# Patient Record
Sex: Male | Born: 1950 | Race: White | Hispanic: No | Marital: Married | Smoking: Never smoker
Health system: Southern US, Community
[De-identification: ages and names within clinical notes are randomized; demographics above are authoritative.]

## PROBLEM LIST (undated history)

## (undated) DIAGNOSIS — F419 Anxiety disorder, unspecified: Secondary | ICD-10-CM

## (undated) DIAGNOSIS — I10 Essential (primary) hypertension: Secondary | ICD-10-CM

## (undated) DIAGNOSIS — E785 Hyperlipidemia, unspecified: Secondary | ICD-10-CM

## (undated) DIAGNOSIS — N189 Chronic kidney disease, unspecified: Secondary | ICD-10-CM

## (undated) DIAGNOSIS — M1990 Unspecified osteoarthritis, unspecified site: Secondary | ICD-10-CM

## (undated) DIAGNOSIS — D869 Sarcoidosis, unspecified: Secondary | ICD-10-CM

## (undated) DIAGNOSIS — G4730 Sleep apnea, unspecified: Secondary | ICD-10-CM

## (undated) HISTORY — PX: CARDIAC CATHETERIZATION: SHX172

## (undated) HISTORY — DX: Sarcoidosis, unspecified: D86.9

## (undated) HISTORY — DX: Chronic kidney disease, unspecified: N18.9

## (undated) HISTORY — DX: Hyperlipidemia, unspecified: E78.5

## (undated) HISTORY — DX: Sleep apnea, unspecified: G47.30

## (undated) HISTORY — DX: Essential (primary) hypertension: I10

---

## 1968-08-06 HISTORY — PX: LUNG BIOPSY: SHX232

## 1998-02-02 ENCOUNTER — Emergency Department (HOSPITAL_COMMUNITY): Admission: EM | Admit: 1998-02-02 | Discharge: 1998-02-02 | Payer: Self-pay | Admitting: Emergency Medicine

## 1998-03-11 ENCOUNTER — Ambulatory Visit: Admission: RE | Admit: 1998-03-11 | Discharge: 1998-03-11 | Payer: Self-pay | Admitting: Internal Medicine

## 1998-03-24 ENCOUNTER — Ambulatory Visit (HOSPITAL_COMMUNITY): Admission: RE | Admit: 1998-03-24 | Discharge: 1998-03-24 | Payer: Self-pay | Admitting: Gastroenterology

## 1998-10-26 ENCOUNTER — Ambulatory Visit (HOSPITAL_COMMUNITY): Admission: RE | Admit: 1998-10-26 | Discharge: 1998-10-26 | Payer: Self-pay | Admitting: Psychiatry

## 1998-11-16 ENCOUNTER — Other Ambulatory Visit: Admission: RE | Admit: 1998-11-16 | Discharge: 1998-11-16 | Payer: Self-pay | Admitting: Urology

## 1998-11-30 ENCOUNTER — Ambulatory Visit (HOSPITAL_COMMUNITY): Admission: RE | Admit: 1998-11-30 | Discharge: 1998-11-30 | Payer: Self-pay | Admitting: Psychiatry

## 1998-12-29 ENCOUNTER — Ambulatory Visit (HOSPITAL_COMMUNITY): Admission: RE | Admit: 1998-12-29 | Discharge: 1998-12-29 | Payer: Self-pay | Admitting: Psychiatry

## 1999-01-31 ENCOUNTER — Ambulatory Visit (HOSPITAL_COMMUNITY): Admission: RE | Admit: 1999-01-31 | Discharge: 1999-01-31 | Payer: Self-pay | Admitting: Psychiatry

## 1999-04-04 ENCOUNTER — Ambulatory Visit (HOSPITAL_COMMUNITY): Admission: RE | Admit: 1999-04-04 | Discharge: 1999-04-04 | Payer: Self-pay | Admitting: Psychiatry

## 1999-06-21 ENCOUNTER — Ambulatory Visit (HOSPITAL_COMMUNITY): Admission: RE | Admit: 1999-06-21 | Discharge: 1999-06-21 | Payer: Self-pay | Admitting: Psychiatry

## 1999-07-25 ENCOUNTER — Ambulatory Visit (HOSPITAL_COMMUNITY): Admission: RE | Admit: 1999-07-25 | Discharge: 1999-07-25 | Payer: Self-pay | Admitting: Psychiatry

## 2000-02-12 ENCOUNTER — Ambulatory Visit: Admission: RE | Admit: 2000-02-12 | Discharge: 2000-02-12 | Payer: Self-pay | Admitting: Critical Care Medicine

## 2000-02-12 ENCOUNTER — Encounter: Payer: Self-pay | Admitting: Critical Care Medicine

## 2000-02-14 ENCOUNTER — Encounter (INDEPENDENT_AMBULATORY_CARE_PROVIDER_SITE_OTHER): Payer: Self-pay | Admitting: Specialist

## 2000-02-14 ENCOUNTER — Other Ambulatory Visit: Admission: RE | Admit: 2000-02-14 | Discharge: 2000-02-14 | Payer: Self-pay | Admitting: Urology

## 2000-02-26 ENCOUNTER — Encounter: Payer: Self-pay | Admitting: Critical Care Medicine

## 2000-02-26 ENCOUNTER — Encounter (INDEPENDENT_AMBULATORY_CARE_PROVIDER_SITE_OTHER): Payer: Self-pay | Admitting: Specialist

## 2000-02-26 ENCOUNTER — Ambulatory Visit (HOSPITAL_COMMUNITY): Admission: RE | Admit: 2000-02-26 | Discharge: 2000-02-26 | Payer: Self-pay | Admitting: Critical Care Medicine

## 2001-12-02 ENCOUNTER — Encounter: Admission: RE | Admit: 2001-12-02 | Discharge: 2001-12-02 | Payer: Self-pay | Admitting: Psychiatry

## 2002-03-14 ENCOUNTER — Emergency Department (HOSPITAL_COMMUNITY): Admission: EM | Admit: 2002-03-14 | Discharge: 2002-03-14 | Payer: Self-pay | Admitting: Emergency Medicine

## 2002-04-10 ENCOUNTER — Encounter: Admission: RE | Admit: 2002-04-10 | Discharge: 2002-04-10 | Payer: Self-pay | Admitting: Psychiatry

## 2002-08-25 ENCOUNTER — Encounter: Payer: Self-pay | Admitting: *Deleted

## 2002-08-26 ENCOUNTER — Encounter (INDEPENDENT_AMBULATORY_CARE_PROVIDER_SITE_OTHER): Payer: Self-pay | Admitting: *Deleted

## 2002-08-26 ENCOUNTER — Ambulatory Visit (HOSPITAL_COMMUNITY): Admission: RE | Admit: 2002-08-26 | Discharge: 2002-08-27 | Payer: Self-pay | Admitting: *Deleted

## 2003-03-26 ENCOUNTER — Encounter: Admission: RE | Admit: 2003-03-26 | Discharge: 2003-03-26 | Payer: Self-pay | Admitting: Emergency Medicine

## 2003-03-26 ENCOUNTER — Encounter: Payer: Self-pay | Admitting: Emergency Medicine

## 2003-04-19 ENCOUNTER — Encounter: Admission: RE | Admit: 2003-04-19 | Discharge: 2003-04-19 | Payer: Self-pay | Admitting: Emergency Medicine

## 2004-06-22 ENCOUNTER — Ambulatory Visit (HOSPITAL_COMMUNITY): Payer: Self-pay | Admitting: Psychiatry

## 2004-08-06 HISTORY — PX: NASAL SINUS SURGERY: SHX719

## 2005-05-25 ENCOUNTER — Emergency Department (HOSPITAL_COMMUNITY): Admission: EM | Admit: 2005-05-25 | Discharge: 2005-05-25 | Payer: Self-pay | Admitting: Emergency Medicine

## 2005-08-06 HISTORY — PX: BACK SURGERY: SHX140

## 2005-08-06 HISTORY — PX: NECK SURGERY: SHX720

## 2005-08-26 ENCOUNTER — Encounter: Admission: RE | Admit: 2005-08-26 | Discharge: 2005-08-26 | Payer: Self-pay | Admitting: Emergency Medicine

## 2005-09-04 ENCOUNTER — Ambulatory Visit (HOSPITAL_COMMUNITY): Payer: Self-pay | Admitting: Psychiatry

## 2005-10-24 ENCOUNTER — Ambulatory Visit (HOSPITAL_COMMUNITY): Payer: Self-pay | Admitting: Psychiatry

## 2005-12-06 ENCOUNTER — Ambulatory Visit (HOSPITAL_COMMUNITY): Admission: RE | Admit: 2005-12-06 | Discharge: 2005-12-07 | Payer: Self-pay | Admitting: Orthopaedic Surgery

## 2005-12-17 ENCOUNTER — Ambulatory Visit (HOSPITAL_COMMUNITY): Payer: Self-pay | Admitting: Psychiatry

## 2006-03-27 ENCOUNTER — Ambulatory Visit (HOSPITAL_COMMUNITY): Payer: Self-pay | Admitting: Psychiatry

## 2006-06-24 ENCOUNTER — Ambulatory Visit (HOSPITAL_COMMUNITY): Payer: Self-pay | Admitting: Psychiatry

## 2007-01-22 ENCOUNTER — Ambulatory Visit (HOSPITAL_COMMUNITY): Payer: Self-pay | Admitting: Psychiatry

## 2007-02-18 ENCOUNTER — Ambulatory Visit (HOSPITAL_COMMUNITY): Admission: RE | Admit: 2007-02-18 | Discharge: 2007-02-18 | Payer: Self-pay | Admitting: Cardiology

## 2007-05-22 ENCOUNTER — Ambulatory Visit (HOSPITAL_COMMUNITY): Payer: Self-pay | Admitting: Psychiatry

## 2007-07-17 ENCOUNTER — Encounter: Admission: RE | Admit: 2007-07-17 | Discharge: 2007-07-17 | Payer: Self-pay | Admitting: Emergency Medicine

## 2007-09-11 ENCOUNTER — Ambulatory Visit (HOSPITAL_COMMUNITY): Payer: Self-pay | Admitting: Psychiatry

## 2008-08-31 ENCOUNTER — Emergency Department (HOSPITAL_COMMUNITY): Admission: EM | Admit: 2008-08-31 | Discharge: 2008-09-01 | Payer: Self-pay | Admitting: Emergency Medicine

## 2008-11-17 ENCOUNTER — Inpatient Hospital Stay (HOSPITAL_COMMUNITY): Admission: RE | Admit: 2008-11-17 | Discharge: 2008-11-21 | Payer: Self-pay | Admitting: *Deleted

## 2008-11-17 ENCOUNTER — Emergency Department (HOSPITAL_COMMUNITY): Admission: EM | Admit: 2008-11-17 | Discharge: 2008-11-17 | Payer: Self-pay | Admitting: Emergency Medicine

## 2008-11-19 ENCOUNTER — Ambulatory Visit: Payer: Self-pay | Admitting: *Deleted

## 2008-11-26 ENCOUNTER — Emergency Department (HOSPITAL_COMMUNITY): Admission: EM | Admit: 2008-11-26 | Discharge: 2008-11-26 | Payer: Self-pay | Admitting: Family Medicine

## 2009-02-01 ENCOUNTER — Emergency Department (HOSPITAL_COMMUNITY): Admission: EM | Admit: 2009-02-01 | Discharge: 2009-02-01 | Payer: Self-pay | Admitting: Emergency Medicine

## 2009-02-04 ENCOUNTER — Ambulatory Visit (HOSPITAL_COMMUNITY): Admission: RE | Admit: 2009-02-04 | Discharge: 2009-02-04 | Payer: Self-pay | Admitting: Internal Medicine

## 2009-02-04 ENCOUNTER — Encounter (HOSPITAL_COMMUNITY): Admission: RE | Admit: 2009-02-04 | Discharge: 2009-04-07 | Payer: Self-pay | Admitting: Emergency Medicine

## 2009-03-02 DIAGNOSIS — E785 Hyperlipidemia, unspecified: Secondary | ICD-10-CM | POA: Insufficient documentation

## 2009-03-02 DIAGNOSIS — F329 Major depressive disorder, single episode, unspecified: Secondary | ICD-10-CM | POA: Insufficient documentation

## 2009-06-01 DIAGNOSIS — R059 Cough, unspecified: Secondary | ICD-10-CM | POA: Insufficient documentation

## 2009-10-14 DIAGNOSIS — R972 Elevated prostate specific antigen [PSA]: Secondary | ICD-10-CM | POA: Insufficient documentation

## 2009-10-14 DIAGNOSIS — N529 Male erectile dysfunction, unspecified: Secondary | ICD-10-CM | POA: Insufficient documentation

## 2009-11-13 ENCOUNTER — Emergency Department (HOSPITAL_COMMUNITY): Admission: EM | Admit: 2009-11-13 | Discharge: 2009-11-13 | Payer: Self-pay | Admitting: Emergency Medicine

## 2010-08-27 ENCOUNTER — Encounter: Payer: Self-pay | Admitting: Emergency Medicine

## 2010-10-25 LAB — POCT I-STAT, CHEM 8
Creatinine, Ser: 1.5 mg/dL (ref 0.4–1.5)
Glucose, Bld: 99 mg/dL (ref 70–99)
HCT: 51 % (ref 39.0–52.0)
Hemoglobin: 17.3 g/dL — ABNORMAL HIGH (ref 13.0–17.0)
Potassium: 4.1 mEq/L (ref 3.5–5.1)
Sodium: 140 mEq/L (ref 135–145)

## 2010-10-25 LAB — DIFFERENTIAL
Basophils Absolute: 0.1 10*3/uL (ref 0.0–0.1)
Monocytes Absolute: 0.6 10*3/uL (ref 0.1–1.0)
Neutrophils Relative %: 79 % — ABNORMAL HIGH (ref 43–77)

## 2010-10-25 LAB — POCT CARDIAC MARKERS
CKMB, poc: 2.9 ng/mL (ref 1.0–8.0)
Troponin i, poc: <0.05 ng/mL (ref 0.00–0.09)
Troponin i, poc: <0.05 ng/mL (ref 0.00–0.09)

## 2010-10-25 LAB — CBC
Platelets: 208 10*3/uL (ref 150–400)
RDW: 13 % (ref 11.5–15.5)
WBC: 9.2 10*3/uL (ref 4.0–10.5)

## 2010-11-15 LAB — POCT I-STAT, CHEM 8
BUN: 22 mg/dL (ref 6–23)
Calcium, Ion: 1.2 mmol/L (ref 1.12–1.32)
Chloride: 104 mEq/L (ref 96–112)
Creatinine, Ser: 1.7 mg/dL — ABNORMAL HIGH (ref 0.4–1.5)
HCT: 40 % (ref 39.0–52.0)
Hemoglobin: 13.6 g/dL (ref 13.0–17.0)
Sodium: 137 mEq/L (ref 135–145)
TCO2: 25 mmol/L (ref 0–100)

## 2010-11-15 LAB — POCT URINALYSIS DIP (DEVICE)
Ketones, ur: NEGATIVE mg/dL
Nitrite: NEGATIVE
Specific Gravity, Urine: >=1.03 (ref 1.005–1.030)
pH: 5.5 (ref 5.0–8.0)

## 2010-11-15 LAB — URINE CULTURE: Culture: NO GROWTH

## 2010-11-15 LAB — HEPATIC FUNCTION PANEL
ALT: 22 U/L (ref 0–53)
AST: 27 U/L (ref 0–37)
Albumin: 3.5 g/dL (ref 3.5–5.2)
Indirect Bilirubin: 0.2 mg/dL — ABNORMAL LOW (ref 0.3–0.9)
Total Bilirubin: 0.3 mg/dL (ref 0.3–1.2)
Total Protein: 6.7 g/dL (ref 6.0–8.3)

## 2010-11-15 LAB — URINALYSIS, ROUTINE W REFLEX MICROSCOPIC
Nitrite: NEGATIVE
Protein, ur: 30 mg/dL — AB

## 2010-11-15 LAB — CBC
MCV: 91.8 fL (ref 78.0–100.0)
Platelets: 232 10*3/uL (ref 150–400)
RBC: 4.34 MIL/uL (ref 4.22–5.81)
RDW: 13.4 % (ref 11.5–15.5)
WBC: 14 10*3/uL — ABNORMAL HIGH (ref 4.0–10.5)

## 2010-11-15 LAB — URINE MICROSCOPIC-ADD ON

## 2010-11-15 LAB — DIFFERENTIAL
Basophils Absolute: 0 10*3/uL (ref 0.0–0.1)
Basophils Relative: 0 % (ref 0–1)
Eosinophils Absolute: 0 10*3/uL (ref 0.0–0.7)
Lymphs Abs: 0.9 10*3/uL (ref 0.7–4.0)
Neutro Abs: 12.1 10*3/uL — ABNORMAL HIGH (ref 1.7–7.7)

## 2010-11-15 LAB — T4, FREE: Free T4: 0.88 ng/dL — ABNORMAL LOW (ref 0.80–1.80)

## 2010-11-15 LAB — RAPID URINE DRUG SCREEN, HOSP PERFORMED
Barbiturates: NOT DETECTED
Benzodiazepines: POSITIVE — AB
Cocaine: NOT DETECTED
Tetrahydrocannabinol: NOT DETECTED

## 2010-11-15 LAB — TSH: TSH: 3.07 u[IU]/mL (ref 0.350–4.500)

## 2010-11-20 LAB — BASIC METABOLIC PANEL
Calcium: 9.4 mg/dL (ref 8.4–10.5)
Chloride: 105 mEq/L (ref 96–112)
Sodium: 136 mEq/L (ref 135–145)

## 2010-11-20 LAB — BRAIN NATRIURETIC PEPTIDE: Pro B Natriuretic peptide (BNP): 44 pg/mL (ref 0.0–100.0)

## 2010-11-20 LAB — DIFFERENTIAL
Basophils Absolute: 0 10*3/uL (ref 0.0–0.1)
Eosinophils Absolute: 0 10*3/uL (ref 0.0–0.7)
Lymphs Abs: 0.8 10*3/uL (ref 0.7–4.0)
Monocytes Absolute: 0.6 10*3/uL (ref 0.1–1.0)
Neutrophils Relative %: 86 % — ABNORMAL HIGH (ref 43–77)

## 2010-11-20 LAB — CK TOTAL AND CKMB (NOT AT ARMC)
Relative Index: 2.3 (ref 0.0–2.5)
Relative Index: 2.3 (ref 0.0–2.5)

## 2010-11-20 LAB — CBC
Hemoglobin: 15.4 g/dL (ref 13.0–17.0)
RDW: 12.8 % (ref 11.5–15.5)
WBC: 10.5 10*3/uL (ref 4.0–10.5)

## 2010-11-20 LAB — GLUCOSE, CAPILLARY: Glucose-Capillary: 139 mg/dL — ABNORMAL HIGH (ref 70–99)

## 2010-11-20 LAB — TROPONIN I: Troponin I: <0.01 ng/mL (ref 0.00–0.06)

## 2010-11-20 LAB — POCT CARDIAC MARKERS: Troponin i, poc: <0.05 ng/mL (ref 0.00–0.09)

## 2010-12-19 NOTE — H&P (Signed)
NAME:  Alfred Stephenson, Alfred Stephenson NO.:  [id number]   MEDICAL RECORD NO.:  [id number]          PATIENT TYPE:  IPS   LOCATION:  0305                          FACILITY:  [facility]   PHYSICIAN:  Jasmine Pang, M.D. DATE OF BIRTH:  May 16, 1951   DATE OF ADMISSION:  11/17/2008  DATE OF DISCHARGE:                       PSYCHIATRIC ADMISSION ASSESSMENT   DATE OF ASSESSMENT:  November 18, 2008 at 10:10 a.m.   IDENTIFYING INFORMATION:  This is a 60 year old male.  This is a  voluntary admission.   HISTORY OF THE PRESENT ILLNESS:  First [facility] admission for this 57-year-  old separated gentleman who was referred by his psychiatric provide  where he presented with suicidal thoughts, and had developed a plan that  he was going to electrocute himself using the generator in the well that  he had at home.  He reports that he has been despondent, hopeless,  inconsolable since his wife moved out September 07, 2008 because she felt  that she needed some time to herself to evaluate their marriage.  He  reports they have been married 36 years, and he does not feel that he  can function without her; is going to kill himself if he cannot get  reconciled with her.  Has begged her to come home, but she continues to  want some time alone in her own apartment.  He reports 1 month of  anhedonia.  Had an episode of constant crying spells, worse a couple of  weeks ago before starting on Ativan.  Suicidal thoughts for somewhere  between one and two weeks.  Sleep decreasing down to about 2-3 hours per  night.  He has kept up his work schedule, has not missed any work.  Tearful with depressed mood.  No interest in life.  Denies  hallucinations or homicidal thoughts.   PAST PSYCHIATRIC HISTORY:  First [facility] admission.  He reports an onset of  depression on or around age 24 and reports at that time having  fluctuating intervals of mostly irritability with verbal outbursts and  denies any history of violence or  physically acting out.  He was started  on Prozac around that time and felt that he did very well and was under  the care of [facility].  After  taking Prozac for quite some time, he does not remember why he came off  of it.  He had a trial of Wellbutrin which did not help him, and then  was on Effexor XR for approximately 7 years with a dose finally  reaching 300 mg daily while under the care of Dr. Ladona Ridgel at [facility].  He began seeing Lyndel Safe and the nurse practitioner at Dr.  Tish Men office [place] about 4 weeks  ago and at that time was started on his current medication, Zoloft.  Denies any trials of mood stabilizers, stimulants or antipsychotics, and  he denies a history of prior suicide attempts.   SOCIAL HISTORY:  Married 34 years.  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 to 7:00 p.m. and then 2 weeks 7:00 p.m. to 7:00  a.m. alternating.  No legal problems.  Has a 65 year old son with his  wife.  They are getting counseling together from [facility], and he is  unsure of the future of their marriage.  Denies any history of substance  abuse.   FAMILY HISTORY:  Remarkable for mother with Alzheimer's and with  unspecified mood disorder.   MEDICAL PROBLEMS:  Hypertension.   PRIMARY CARE Tekoa Hamor:  Dr. Jarold Motto at [facility].   DRUG ALLERGIES:  NONE.   MEDICATIONS:  1. Unknown blood pressure medication.  2. Zoloft 150 mg daily.  3. Lorazepam 1 mg q.i.d. which he reports he has been taking for about      3 or 4 weeks because he was shaking so much on the Zoloft.   POSITIVE PHYSICAL FINDINGS:  A fully alert male, cooperative, in no  distress, with normal vital signs on presentation.  Full physical exam  done in the emergency room as noted in the record.   LABORATORY DATA:  Chemistry  - sodium 137, potassium 3.3, chloride 104,  carbon dioxide is 25, BUN 22, creatinine 1.7.  CBC - WBC 14, hemoglobin  13.6, hematocrit 39.8, platelets 207,000 and MCV 91.8.  Urine drug  screen was positive for benzodiazepines and alcohol level less than 5.   MENTAL STATUS EXAM:  Reveals a fully alert male with a blunted affect,  somewhat decreased concentration and mildly anxious in appearance.  Clearly focused on his severe grief and loss issues.  Speech normal.  Gives a fairly coherent history.  Mood is quite depressed.  Cannot see  any other options except to be reunited with his wife.  Apparently had  been calling her three times a day and possibly more.  Now is restricted  to calling her once a day, and she will not recommit to the marriage.  He feels hopeless about the future.  Cognition is intact.  No delusional  statements.  No evidence of psychosis.  No homicidal thoughts.   IMPRESSION:  AXIS I:  Major depression, recurrent, severe.  AXIS II:  Deferred.  AXIS III:  Hypertension by history.  AXIS IV:  Severe issues with relationships and marital separation.  AXIS V:  Current 42; past year not known.   PLAN:  The plan is to voluntarily admit with a goal of stabilizing his  mood and alleviating his suicidal thoughts.  We are going to start him  on Celexa 20 mg daily and discontinue the Zoloft and Abilify 2 mg daily.      Margaret A. Lorin Picket, N.P.      Jasmine Pang, M.D.  Electronically Signed    MAS/MEDQ  D:  11/18/2008  T:  11/18/2008  Job:  [id number]

## 2010-12-19 NOTE — Cardiovascular Report (Signed)
NAME:  Alfred Stephenson, Alfred Stephenson NO.:  [id number]   MEDICAL RECORD NO.:  [id number]          PATIENT TYPE:  INP   LOCATION:  2857                         FACILITY:  [facility]   PHYSICIAN:  Francisca December, M.D.  DATE OF BIRTH:  [birth year]   DATE OF PROCEDURE:  02/18/2007  DATE OF DISCHARGE:                            CARDIAC CATHETERIZATION   PROCEDURES PERFORMED:  1. Left heart catheterization.  2. Left ventriculogram.  3. Coronary angiography.   INDICATIONS:  Mr. Alfred Stephenson is a 60 year old man, with several risk  factors for coronary heart disease, who has developed an atypical  anginal syndrome.  A myocardial perfusion study showed he was able to  exercise for 10 minutes and 15 seconds to a heart rate of 95% maximal  predicted.  Felt somewhat short of breath. Did not have develop index  symptoms.  There was a reversible inferior, inferoseptal, and mid  inferior defect.  Not completely reversible.  He is brought to the  catheterization laboratory at this time to identify the extent of  disease and provide for further therapeutic options.   PROCEDURAL NOTE:  The patient is brought to the cardiac catheterization  laboratory in the fasting state.  The right groin was prepped and draped  in the usual sterile fashion.  Local anesthesia was obtained with  infiltration 1% lidocaine.  A 5-French catheter sheath was inserted into  the right femoral artery utilizing an anterior approach over guiding J-  wire.  Left heart catheterization and coronary angiography then  proceeded in the standard fashion using 5-French #4 left and right  Judkins catheters and a 110 cm pigtail catheter.  A 30-degrees RAO cine  left ventriculogram was performed utilizing a power injector; 45 mL were  injected at 13 mL per second.  At the completion of the procedure, the  catheter and catheter sheath were removed.  Hemostasis was achieved by  direct pressure.  The patient was transported to the  recovery area in  stable condition with an intact distal pulse.  All catheter  manipulations were performed using fluoroscopic observation and  exchanges performed over a long guiding J-wire.   HEMODYNAMIC RESULTS:  Systemic arterial pressure is 103/70 with a mean  of 86 mmHg.  There is no systolic gradient across the aortic valve.  The  left ventricular end-diastolic pressure is 13 mmHg pre-ventriculogram.   ANGIOGRAPHY:  The left ventriculogram demonstrated normal chamber size  and normal global systolic function without regional wall motion  abnormality.  A visual estimated ejection fraction is 55-60%.  There is  no mitral vegetation.  There is no coronary calcification seen.  The  aortic valve is trileaflet and opens normally during systole.   There is a right-dominant coronary system present.  The main left  coronary is normal.   The left anterior descending artery and its branches are normal.  The  single large diagonal branch arises.  This is fairly proximal.  It is  proximal to the first septal perforator.  It contains no obstruction.  The ongoing anterior descending artery reaches and barely traverses the  apex.  There  are no obstructions seen nor are there even luminal  irregularities.   The left circumflex coronary artery and its branches are normal.  A  large branching first marginal is present and then two very small second  and third marginals.  The fourth marginal is on the true obtuse margin  of the heart, and then there is a small posterolateral branch.  There  are no obstructions in the main trunk of the circumflex or in these  branch vessels.   The right coronary artery and its branches are large and dominant.  There is a 20% eccentric narrowing at the junction of the mid proximal  segment.  The mid and distal segments otherwise are completely free of  any disease or luminal irregularities.  The distal segment of the right  coronary gives rise to a large  posterior descending artery and a large  posterolateral segment with a single large branching posterolateral or  left ventricular branch.  There are no obstructions in these distal  vessels either.   Collateral vessels are not seen.   FINAL IMPRESSION:  1. Essentially normal coronary arteries without evidence of      atherosclerotic obstruction.  2. Intact left ventricular size and global systolic function, ejection      fraction 55-60%.   PLAN:  Continue medical therapy and risk factor modification only are  indicated.      Francisca December, M.D.  Electronically Signed     JHE/MEDQ  D:  02/18/2007  T:  02/18/2007  Job:  [id number]   cc:   Reuben Likes, M.D.

## 2010-12-19 NOTE — Discharge Summary (Signed)
NAME:  Alfred Stephenson, Alfred Stephenson NO.:  [id number]   MEDICAL RECORD NO.:  [id number]          PATIENT TYPE:  IPS   LOCATION:  0305                          [facility]ACILITY[facility]   PHYSICIAN:  Jasmine Pang, M.D. DATE OF BIRTH:  Feb 17, 1951   DATE OF ADMISSION:  11/17/2008  DATE OF DISCHARGE:  11/21/2008                               DISCHARGE SUMMARY   IDENTIFICATION:  This is a 60 year old separated white male who was  admitted on a voluntary basis.   HISTORY OF PRESENT ILLNESS:  This is the first [facility] admission for this 42-  year-old separated gentleman, who was referred by his psychiatric  Irbin Fines,  Colen Darling.  He presented to her with suicidal thoughts and  had developed a plan that he was going to electrocute himself using a  generator and avail that he had at home.  He reports he has been  despondent, hopeless, and consolable since his wife moved out September 07, 2008 because she felt she needed sometime to herself to evaluate  their marriage.  For further admission information, see psychiatric  admission assessment.   PHYSICAL FINDINGS:  The patient's full physical exam was done in the  emergency room.  There were no acute physical or medical problems noted.   ADMISSION LABORATORIES:  Chemistries revealed a sodium of 137, potassium  of 3.3, chloride of 104, carbon dioxide is 25, BUN of 22, creatinine  1.7.  CBC reveals WBC of 14, hemoglobin of 13.6, hematocrit of 39.8,  platelets 207,000, and MCV was 91.8.  Urine drug screen was positive for  benzodiazepines and alcohol level less than 5.  He is prescribed  lorazepam.   HOSPITAL COURSE:  Upon admission, the patient was continued on his home  medication of Ativan 1 mg p.o. q.i.d. and Zoloft 150 mg p.o. daily.  He  was also started on Ambien 10 mg p.o. q.h.s. p.r.n. insomnia.  On November 18, 2008, the patient was started on Abilify 2 mg p.o. q. day and Ativan  was changed to 1 mg p.o. t.i.d. p.r.n. anxiety.  Zoloft  was  discontinued.  He was started on Celexa 20 mg p.o. q. day.  In  individual sessions, the patient was reserved, but cooperative.  He  stated he felt like the Zoloft was not helping and it was switched to  Celexa as indicated above.  He denied he wants to hurt himself.  He does  note increasing depression with frequent tearfulness.  On November 19, 2008, the patient continued to be depressed and anxious.  He was  ruminating about separation with his wife I still miss her.  He stated  his wife has agreed to go to marital counseling.  The patient was  focused on wanting to go home.  On November 21, 2008, sleep was good.  Appetite good.  Mood was less depressed, less anxious.  Affect  consistent with mood.  There was no suicidal or homicidal ideation.  No  thoughts of self-injurious behavior.  No auditory or visual  hallucinations.  No paranoia or delusions.  Thoughts were logical and  goal-directed.  Thought content.  No predominant theme.  Cognitive was  grossly intact.  It was felt the patient was safe for discharge.  There  was a brief meeting with his wife and the counselor.  Wife was concerned  that he may be getting discharge to quickly, but the patient says he has  been going to groups and speaking with staff.  He no longer feels  suicidal and feels ready for discharge.  He feels new medications were  helping him.  Wife agreed that he did seem improved in terms of less  anxiety and less emotionality.  Wife said the patient does have  supportive friends, who are going to come by to see the patient today.  The patient says if he has suicidal thoughts, he will call his wife or  his mental health providers.  He plans to continue seeing his mental  health providers on a weekly basis.  After talking with their counselor,  wife felt the patient would not receive any further benefit from  hospitalization and would be better returning to his outpatient  providers.   DISCHARGE DIAGNOSES:  Axis  I:  Major depression, recurrent severe  without psychosis.  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  Axis III:  Hypertension by history.  Axis IV:  Severe (issues with relationship and marital separation,  burden of psychiatric illness).  Axis V:  Global assessment of functioning was 54 upon discharge.  GAF  was 44 upon admission.  GAF highest past year was 70.   DISCHARGE PLANS:  There was no specific activity level or dietary  restrictions.   POST HOSPITAL CARE PLANS:  The patient will see Clerance Lav T for  followup medication management.  He will see a Colen Darling, his  counselor for further therapy.   DISCHARGE MEDICATIONS:  1. Abilify 2 mg daily.  2. Celexa 20 mg daily.      Jasmine Pang, M.D.  Electronically Signed     BHS/MEDQ  D:  11/22/2008  T:  11/23/2008  Job:  [id number]

## 2010-12-22 NOTE — Op Note (Signed)
NAME:  Alfred Stephenson, Alfred Stephenson NO.:  [id number]   MEDICAL RECORD NO.:  [id number]          PATIENT TYPE:  OIB   LOCATION:  5006                         FACILITY:  [facility]   PHYSICIAN:  Sharolyn Douglas, M.D.        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   PROCEDURES:  1.  Anterior cervical diskectomy, C5-6 and C6-7, with decompression of the      spinal cord and nerve roots bilaterally.  2.  Anterior cervical arthrodesis, C5-6 and C6-7, with placement of two      allograft prosthesis spacers packed with local autogenous bone graft.  3.  Anterior cervical plating, C5 through C7, using the Abbott spine system.   SURGEON:  Sharolyn Douglas, M.D.   ASSISTANT:  Verlin Fester, P.A.   ANESTHESIA:  General endotracheal.   COMPLICATIONS:  None.   ESTIMATED BLOOD LOSS:  Minimal.   INDICATIONS:  The patient is a 60 year old pleasant male with persistent  neck and right greater than left upper extremity pain.  His MRI scan shows  degenerative changes with disk herniations and spondylosis most severe at C5-  6 and C6-7 with underlying spinal stenosis and foraminal narrowing.  At this  time he presents for ACDF at these levels in hopes of improving his  symptoms.  Risk and benefits were reviewed.   PROCEDURE:  The patient was identified in the holding area and taken to the  operating room, underwent general endotracheal anesthesia without  difficulty, given prophylactic IV antibiotics.  He was carefully positioned  on the operating room table with his neck in slight extension on the  Mayfield headrest.  Five pounds of halter traction applied.  The neck was  prepped, draped in the usual sterile fashion.  A small transverse incision  made at the level of the cricoid cartilage  on the left side.  Dissection was  carried to the platysma.  The interval between the SCM and strap muscles  medially was developed down to the prevertebral space.  The esophagus,  trachea and carotid sheath were identified and protected at all times.  The  C5-6 disk space was easily identifiable by the large anterior osteophyte.  My assistant helped during the exposure using the handheld retractors.  A  spinal needle was placed into the C5-6 disk space.  Intraoperative x-ray  taken to confirm this level.  We then exposed the C5-6 and C6-7 disk spaces  bilaterally by elevating the longus colli muscle, deep retractors were  placed.  The microscope was draped and brought into the field.  My assistant  worked under the microscope with me using the sucker and retracting.  Caspar  distraction pins were placed in the C5, C6, C7 vertebral bodies and gentle  distraction applied.  Starting at C6-7, a radical diskectomy was completed  back to the posterior longitudinal ligament.  The uncovertebral joints were  overgrown.  The cartilaginous endplates were scraped clean.  A high-speed  bur used to take down the uncovertebral joints and the posterior vertebral  margins.  A 2 mm Kerrison used to remove the posterior longitudinal  ligament.  We found subligamentous disk herniations which extended into the  foramen, right greater than left.  This was completely decompressed.  Foraminotomies were completed.  Nerve hook used to explore the spinal canal  and foramen.  We felt we had a good decompression.  Hemostasis achieved.  A  6 mL allograft prosthesis spacer was then packed with local autogenous bone  graft from the drill shavings.  This was countersunk 1 mm into the  interspace at C6-7.  We then turned our attention to completing a similar  procedure at C5-6.  Again we found subligamentous disk herniations which  extended into the right foramen.  The posterior longitudinal ligament was  completely taken  down.  Wide foraminotomies were completed, the disk  herniation was decompressed.  The canal was again explored with the nerve  hook and we felt that it was well-decompressed.  Hemostasis achieved.  This  time a 7 mm allograft prosthesis spacer was packed with local bone graft  from the drill shavings and again countersunk 1 mm into the interspace.  We  then applied a 42 mm Abbott Spine anterior cervical plate from Z6-X0 was six  12 mm screws.  We had excellent screw purchase and we ensured that the  locking mechanism engaged.  The wound was irrigated.  The esophagus,  trachea, carotid  sheath were evaluated.  There were no apparent injuries.  Hemostasis was achieved.  Deep Penrose drain left in place.  The platysma  closed with interrupted 2-0 Vicryl, subcutaneous layer closed with 3-0  Vicryl and 4-0 subcuticular Vicryl in the skin edges.  Benzoin and Steri-  Strips placed.  Sterile dressing applied.  Soft collar placed.  The patient  was extubated without difficulty and transferred to recovery in stable  condition able to move his upper and lower extremities.  Intraoperative x-  ray taken showed the instrumentation at C5-6.  It was difficult to visualize  the C6-7 level because of the patient's body habitus.  Formal x-rays will be  taken in the radiology department before discharge.      Sharolyn Douglas, M.D.  Electronically Signed     MC/MEDQ  D:  12/06/2005  T:  12/07/2005  Job:  [id number]

## 2010-12-22 NOTE — Op Note (Signed)
NAME:  Alfred Stephenson, Alfred Stephenson NO.:  [id number]   MEDICAL RECORD NO.:  [id number]                   PATIENT TYPE:  OIB   LOCATION:  2550                                 FACILITY:  [facility]   PHYSICIAN:  Veverly Fells. Arletha Grippe, M.D.             DATE OF BIRTH:  Jul 22, 1951   DATE OF PROCEDURE:  08/26/2002  DATE OF DISCHARGE:                                 OPERATIVE REPORT   PREOPERATIVE DIAGNOSIS:  Obstructive sleep apnea, septal deviation, inferior  turbinate hypertrophy and nasal airway obstruction.   POSTOPERATIVE DIAGNOSIS:  Obstructive sleep apnea, septal deviation,  inferior turbinate hypertrophy and nasal airway obstruction.   OPERATION PERFORMED:  Nasal septum reconstruction, intramural cauterization  both inferior turbinates.   SURGEON:  Veverly Fells. Arletha Grippe, M.D.   ANESTHESIA:  General endotracheal.   INDICATIONS FOR PROCEDURE:  The patient is a 60 year old white male being  followed by Clinton D. Maple Hudson, M.D. and Reuben Likes, M.D.  History of  recurrent epistaxis, nasal airway obstruction.  He has been diagnosed with  moderately severe obstructive sleep apnea based on a sleep test in March of  1999.  Respiratory disturbance index was noted to be 40 events per hour with  desaturations down to 83% with normal cardiac rhythm.  He has tried nasal  CPAP and actually BIPAP but due to his nasal anatomy, he has had significant  problems wearing this apparatus.  Physical examination did show severe  septal deviation to the left with inferior turbinate congestion and  hypertrophy.  Based on his history and physical examination, I have  recommended proceeding with the above-noted surgical procedures so he could  wear nasal CPAP more effectively.  I have discussed with him extensively  risks and benefits of surgery including the risks of general anesthesia,  infection, bleeding and normal recovery period to expect after this type of  surgery.  I have entertained  any questions, answered them appropriately.  Informed consent has been obtained and the patient presents for the above-  noted procedure.   OPERATIVE FINDINGS:  Severe septal deviation left with inferior turbinate  congestion and hypertrophy.   DESCRIPTION OF PROCEDURE:  The patient was brought to the operating room and  placed in supine position.  General endotracheal anesthesia was administered  via the anesthesiologist without complication.  The patient was administered  1 gm of Ancef IV times one and 10 mg of Decadron IV times one.  The head of  the table was elevated 30 degrees.  A throat pack was placed in the  posterior pharynx using a small baby lap sponge.  The patient's face was  draped in standard fashion.  Cotton pledgets soaked in a 4% cocaine solution  were placed in both nares and left in place for approximately five to 10  minutes and then removed.  Both sides of the septum were infiltrated with a  1% lidocaine solution with 1:100,000 epinephrine.  After waiting  approximately 10 minutes, a standard Killian incision was made in the left  side of the septum.  Mucoperichondrium and mucoperiosteal flap was elevated  on the left side using both blunt and sharp dissection.  An  intracartilaginous incision was made approximately 1 cm posterior from the  caudal end of the septum.  A mucoperichondrial and mucoperiosteal flap was  elevated on the right side using both blunt and sharp dissection.  Cartilaginous deviations were removed with a Jansen-Middleton forceps  posterior bony deflection.  Cartilaginous deviation was removed with a  Ballenger swivel knife.  Posterior bony deflection was removed with the  Jansen-Middleton forceps and the septal spur which was pushing off the left  nasal chamber was also removed with the Jansen-Middleton forceps.  A piece  of trimmed morselized cartilage was placed between the septal flaps.  The  septal incision was closed with interrupted  chromic suture.  The septum was  reinforced with a running transeptal plain gut mattress stitch.  Both  inferior turbinates were injected with a total of 6 cc of 1% lidocaine  solution with 1:100,000 epinephrine.  Both inferior turbinates were then  intramurally cauterized using the Elmed intramural cauterization unit on a  12 watt setting.  Three passes of both inferior turbinates were performed  without difficulty and both inferior turbinates were then outfractured using  gentle lateral pressure with a large nasal speculum.  Doyle splints soaked  in a Bactroban ointment solution were placed on either side of the septum  and held in place with a transeptal Prolene suture.  Throat pack was removed  and an orogastric tube was placed.  This was used to decompress the stomach  contents.  It was then removed without incident.  Fluids given in the  procedure were approximately 800 cc of crystalloid.  The estimated blood  loss for this procedure was less than 30 cc.  Urine output not measured.  There were no drains.  The above-noted two Doyle splints were placed.  Specimens sent were septal cartilage and bone for gross pathology only.  The  patient tolerated the procedure well without complications, was extubated in  the operating room and transferred to the recovery room in stable condition.  Sponge, needle and instrument counts were correct at the end of the  procedure.  Total duration of the procedure was approximately one hour.  The  patient will be admitted for overnight recovery in a monitored situation.  Once he has recovered well, he will be sent home on August 27, 2002.  He  will be sent home on Keflex 500 mg p.o. t.i.d. for 10 days and Vicodin #30  with two refills one to two tablets p.o. q.4h. p.r.n. pain.  He is to have  light activity, no heavy lifting or nose blowing for two weeks after surgery.  Both he and his family were given oral and written instructions.  They are to call for  any problems with bleeding, fever, vomiting, pain,  reactions to medications or any other questions.  He will follow up in the  office for splint removal on Tuesday, January 27 at 2:25 p.m.                                                Veverly Fells. Arletha Grippe, M.D.    MDR/MEDQ  D:  08/26/2002  T:  08/26/2002  Job:  [id number]   cc:   Joni Fears D. Young, M.D.  [address] [place]  [place] [address]  Fax: [phone]   Reuben Likes, M.D.  [address]  [place]  [place] [address]  Fax: [phone]

## 2010-12-22 NOTE — H&P (Signed)
NAME:  Alfred Stephenson, Alfred Stephenson NO.:  [id number]   MEDICAL RECORD NO.:  [id number]            PATIENT TYPE:   LOCATION:                                 FACILITY:   PHYSICIAN:  Sharolyn Douglas, M.D.        DATE OF BIRTH:  10/01/1950   DATE OF ADMISSION:  12/06/2005  DATE OF DISCHARGE:                                HISTORY & PHYSICAL   CHIEF COMPLAINT:  Neck and right upper extremity pain.   HISTORY OF PRESENT ILLNESS:  Patient is a 60 year old male who has been  having problems with his neck for a long time now.  He states it has not  improved with conservative treatment.  It is thought his best course of  management would be an ACDF C5-C7.  Risks and benefits of this were  discussed with the patient by Dr. Noel Gerold.  He indicated understanding and  opted to proceed.   ALLERGIES:  None.   MEDICATIONS:  Effexor, Wellbutrin, hydrochlorothiazide, Lipitor, lisinopril,  and Proscar.   PAST MEDICAL HISTORY:  1.  Hypertension.  2.  BPH.   PAST SURGICAL HISTORY:  1.  Deviated septum repair.  2.  Heart catheterization.  3.  Heel spur removal.   SOCIAL HISTORY:  Patient denies tobacco use.  Denies alcohol use.  He is  married.  He has one grown son.  His wife will be available to help him  through his postoperative course.   FAMILY HISTORY:  Noncontributory.   REVIEW OF SYSTEMS:  Patient denies fevers, chills, sweats, or bleeding  tendencies.  CNS:  Denies blurred vision, double vision, seizures, headache,  paralysis.  CARDIOVASCULAR:  Denies chest pain, angina, orthopnea,  claudication, palpitations.  PULMONARY:  Denies shortness of breath,  productive cough, hemoptysis.  GI:  Denies nausea, vomiting, constipation,  diarrhea, melena, bloody stool.  GU:  Denies dysuria, hematuria, or  discharge.  MUSCULOSKELETAL:  As per HPI.   PHYSICAL EXAMINATION:  VITAL SIGNS:  Pulse is 76, respirations 18,  unlabored, blood pressure 120/70.  GENERAL APPEARANCE:  Patient is a 60 year old  white male who is alert and  oriented, no acute distress.  Well-nourished, well groomed.  Appears stated  age.  Pleasant, cooperative to the examination.  HEENT:  Head is normocephalic, atraumatic.  Pupils are equal, round, and  reactive to light.  Extraocular movements intact.  Nares are patent.  Pharynx is clear.  NECK:  Soft to palpation.  No lymphadenopathy, thyromegaly, or bruits  appreciated.  CHEST:  Clear to auscultation bilaterally.  No rales, rhonchi, strider,  wheezes, __________.  BREASTS:  Not pertinent.  Not performed.  HEART:  S1, S2.  Regular rate and rhythm.  No murmurs, rubs, or gallops  noted.  ABDOMEN:  Soft to palpation, nontender, nondistended.  No organomegaly  noted.  GENITOURINARY:  Not pertinent.  Not performed.  EXTREMITIES:  As per HPI.  SKIN:  Intact without any lesions or rashes.   IMPRESSION:  1.  Cervical spondylitic radiculopathy.  2.  Hypertension.  3.  Benign prostatic hypertrophy.  4.  Depression.   PLAN:  1.  Admit to [facility] on Dec 06, 2005 for an ACDF C5-C7.  This      will be done by Dr. Sharolyn Douglas.  2.  Primary care doctor is Dr. Leslee Home.      Verlin Fester, P.A.      Sharolyn Douglas, M.D.  Electronically Signed    CM/MEDQ  D:  12/07/2005  T:  12/07/2005  Job:  [id number]   cc:   Sharolyn Douglas, M.D.  Fax: [phone]

## 2010-12-22 NOTE — Procedures (Signed)
. [facility]  Patient:    ANGLE, KAREL                      MRN: [id number] Proc. Date: 02/26/00 Adm. Date:  [id number] Attending:  Caleb Popp CC:         Reuben Likes, M.D.                           Procedure Report  PROCEDURE:  Bronchoscopy.  CHIEF COMPLAINT:  Followup sarcoidosis. History of bilateral interstitial infiltrates and hilar adenopathy. Previous history of sarcoid in the past.  OPERATOR:  Charlcie Cradle. Delford Field, M.D.  ANESTHESIA: 1% Xylocaine local.  PREOPERATIVE MEDICATIONS: Demerol 40 mg IV push. Versed 4 mg IV push.  DESCRIPTION OF PROCEDURE:  The Olympus video bronchoscope was introduced into the right nares. The upper airways were visualized and were unremarkable. The entire tracheal bronchial tree was visualized and revealed diffuse tracheal bronchitis in a cobble stone type pattern with inflammation to the system with endobronchial sarcoid. Transbronchial biopsies x 5 were obtained from the left upper lobe. Some bleeding was encountered and this was controlled with topical thrombin and epinephrine.  COMPLICATIONS: Minimal bleeding controlled with thrombin and epinephrine.  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  RECOMMENDATIONS:  Followup pathology. DD:  02/26/00 TD:  02/27/00 Job: [id number]

## 2011-02-12 DIAGNOSIS — J159 Unspecified bacterial pneumonia: Secondary | ICD-10-CM | POA: Insufficient documentation

## 2011-02-12 DIAGNOSIS — J309 Allergic rhinitis, unspecified: Secondary | ICD-10-CM | POA: Insufficient documentation

## 2011-03-08 ENCOUNTER — Other Ambulatory Visit: Payer: Self-pay | Admitting: Internal Medicine

## 2011-03-08 DIAGNOSIS — R05 Cough: Secondary | ICD-10-CM

## 2011-03-13 ENCOUNTER — Ambulatory Visit
Admission: RE | Admit: 2011-03-13 | Discharge: 2011-03-13 | Disposition: A | Discharge status: Home / Self Care | Payer: 59 | Source: Ambulatory Visit | Attending: Internal Medicine | Admitting: Internal Medicine

## 2011-03-13 DIAGNOSIS — R05 Cough: Secondary | ICD-10-CM

## 2011-03-13 MED ORDER — IOHEXOL 300 MG/ML  SOLN
50.0000 mL | Freq: Once | INTRAMUSCULAR | Status: AC | PRN
  Administered 2011-03-13: 50 mL via INTRAVENOUS

## 2011-04-17 ENCOUNTER — Other Ambulatory Visit: Payer: 59

## 2011-04-17 ENCOUNTER — Encounter: Payer: Self-pay | Admitting: Internal Medicine

## 2011-04-17 ENCOUNTER — Telehealth: Payer: Self-pay | Admitting: Internal Medicine

## 2011-04-17 ENCOUNTER — Ambulatory Visit (INDEPENDENT_AMBULATORY_CARE_PROVIDER_SITE_OTHER): Payer: 59 | Admitting: Internal Medicine

## 2011-04-17 DIAGNOSIS — G4733 Obstructive sleep apnea (adult) (pediatric): Secondary | ICD-10-CM | POA: Insufficient documentation

## 2011-04-17 DIAGNOSIS — J984 Other disorders of lung: Secondary | ICD-10-CM

## 2011-04-17 DIAGNOSIS — R911 Solitary pulmonary nodule: Secondary | ICD-10-CM

## 2011-04-17 DIAGNOSIS — D869 Sarcoidosis, unspecified: Secondary | ICD-10-CM

## 2011-04-17 NOTE — Assessment & Plan Note (Signed)
Probably old sarcoid scarring  - we will get PFT and ACE, but don't expect need to treat based on current symptoms

## 2011-04-17 NOTE — Patient Instructions (Signed)
Order- PFT- dx sarcoid              Lab- ACE level

## 2011-04-17 NOTE — Telephone Encounter (Signed)
This date and time is okay; thanks for checking.

## 2011-04-17 NOTE — Progress Notes (Signed)
Subjective:    Patient ID: Alfred Stephenson, male    DOB: 1951-01-28, 60 y.o.   MRN: [id number]  HPI 04/17/11- 60 year old male never smoker referred courtesy of Dr. Jarome Matin with concern of abnormal chest x-ray and history of sarcoid. Wife is here today. He was diagnosed with sarcoid by mediastinoscopy in 1970 when he presented with abnormal chest x-ray. He was never treated. A few months ago he had a chest cold. Chest x-ray showed a cloudy spot. On repeat he had 2 "cloudy spots", leading to a CT scan of the chest with contrast on 03/13/2011. There were multiple small noncalcified lung nodules throughout the upper lobes primarily. A 13 mm dominant nodule in the left upper lobe peripherally was identified consideration of CT scan followup. There were mediastinal and hilar adenopathy and multiple groundglass opacities in the upper lobes which might be areas of activity. Images were reviewed with them. He feels back to normal with the winter cough resolved. He denies sweats or fever, adenopathy, rash, chest pain or cough. There is no history of tuberculosis exposure PPD was negative in the past. 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 He denies history of asthma. He was diagnosed with obstructive sleep apnea and had a sleep study about a year ago with Dr.Dohmeier, but he never went back for followup and was not called. He had used CPAP but it is not working correctly because the mask leaks. He asks that I manage this problem. His wife says he snores loudly and he admits feeling very tired.   Review of Systems Per HPI Constitutional:   No-   weight loss, night sweats, fevers, chills, fatigue, lassitude. HEENT:   No-  headaches, difficulty swallowing, tooth/dental problems, sore throat,       No-  sneezing, itching, ear ache, nasal congestion, post nasal drip,  CV:  No-   chest pain, orthopnea, PND, swelling in lower extremities, anasarca, dizziness, palpitations Resp: Some shortness of  breath with exertion or at rest.              No-   productive cough,  No non-productive cough,  No-  coughing up of blood.              No-   change in color of mucus.  No- wheezing.   Skin: No-   rash or lesions. GI:  No-   heartburn, indigestion, abdominal pain, nausea, vomiting, diarrhea,                 change in bowel habits, loss of appetite GU: No-   dysuria, change in color of urine, no urgency or frequency.  No- flank pain. MS:  No-   joint pain or swelling.  No- decreased range of motion.  No- back pain. Neuro- grossly normal to observation, Or:  Psych:  No- change in mood or affect. Some depression or anxiety.  No memory loss.      Objective:   Physical Exam General- Alert, Oriented, Affect-appropriate, Distress- none acute Skin- rash-none, lesions- none, excoriation- none Lymphadenopathy- none Head- atraumatic            Eyes- Gross vision intact, PERRLA, conjunctivae clear secretions            Ears- Hearing, canals-normal            Nose- Clear, no-Septal dev, mucus, polyps, erosion, perforation             Throat- Mallampati II , mucosa clear ,  drainage- none, tonsils- atrophic Neck- flexible , trachea midline, no stridor , thyroid nl, carotid no bruit Chest - symmetrical excursion , unlabored           Heart/CV- RRR , no murmur , no gallop  , no rub, nl s1 s2                           - JVD- none , edema- none, stasis changes- none, varices- none           Lung- clear to P&A, wheeze- none, cough- none , dullness-none, rub- none           Chest wall-  Abd- tender-no, distended-no, bowel sounds-present, HSM- no. Male pattern obesity Br/ Gen/ Rectal- Not done, not indicated Extrem- cyanosis- none, clubbing, none, atrophy- none, strength- nl Neuro- grossly intact to observation         Assessment & Plan:

## 2011-04-17 NOTE — Assessment & Plan Note (Addendum)
He would like to be followed here for this and will speak to Dr Eloise Harman about it. He has signed permission for [place] to request a copy of his sleep study report from the neurology office. 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

## 2011-04-18 DIAGNOSIS — D869 Sarcoidosis, unspecified: Secondary | ICD-10-CM | POA: Insufficient documentation

## 2011-04-18 DIAGNOSIS — R918 Other nonspecific abnormal finding of lung field: Secondary | ICD-10-CM | POA: Insufficient documentation

## 2011-04-18 NOTE — Assessment & Plan Note (Signed)
The 13 mm left upper lobe nodule can be followed; concern is not high since he has never smoked

## 2011-05-03 ENCOUNTER — Encounter: Payer: Self-pay | Admitting: Internal Medicine

## 2011-05-04 ENCOUNTER — Telehealth: Payer: Self-pay | Admitting: Internal Medicine

## 2011-05-04 NOTE — Telephone Encounter (Signed)
Encounter closed in error and forwarded back to [facility]

## 2011-05-04 NOTE — Telephone Encounter (Signed)
Spoke with Samara Deist. She states that Florentina Addison called her and wanted to set pt up to have labs drawn. She is wanting to go ahead and set this up. I do not see mention of this in his chart. Will forward to [place]. Please advise, thanks!

## 2011-05-07 NOTE — Telephone Encounter (Signed)
Spoke with patients wife-aware of results as patient was asleep due to work schedule. If any questions or concerns patient will call me.

## 2011-05-07 NOTE — Progress Notes (Signed)
Quick Note:  Pt aware of results via wife. ______ 

## 2011-05-21 LAB — BASIC METABOLIC PANEL
CO2: 26
Calcium: 9.2
GFR calc Af Amer: 57 — ABNORMAL LOW
GFR calc non Af Amer: 48 — ABNORMAL LOW
Glucose, Bld: 95
Potassium: 3.9
Sodium: 140

## 2011-06-01 ENCOUNTER — Ambulatory Visit: Payer: 59 | Admitting: Internal Medicine

## 2011-06-19 ENCOUNTER — Ambulatory Visit (INDEPENDENT_AMBULATORY_CARE_PROVIDER_SITE_OTHER): Payer: 59 | Admitting: Internal Medicine

## 2011-06-19 ENCOUNTER — Encounter: Payer: Self-pay | Admitting: Internal Medicine

## 2011-06-19 VITALS — BP 110/80 | HR 71 | Ht 71.0 in | Wt 237.0 lb

## 2011-06-19 DIAGNOSIS — J984 Other disorders of lung: Secondary | ICD-10-CM

## 2011-06-19 DIAGNOSIS — G4733 Obstructive sleep apnea (adult) (pediatric): Secondary | ICD-10-CM

## 2011-06-19 DIAGNOSIS — G4769 Other sleep related movement disorders: Secondary | ICD-10-CM

## 2011-06-19 DIAGNOSIS — D869 Sarcoidosis, unspecified: Secondary | ICD-10-CM

## 2011-06-19 DIAGNOSIS — R911 Solitary pulmonary nodule: Secondary | ICD-10-CM

## 2011-06-19 DIAGNOSIS — R918 Other nonspecific abnormal finding of lung field: Secondary | ICD-10-CM

## 2011-06-19 LAB — PULMONARY FUNCTION TEST

## 2011-06-19 MED ORDER — ROPINIROLE HCL 0.25 MG PO TABS: ORAL_TABLET | ORAL | Status: DC

## 2011-06-19 NOTE — Progress Notes (Signed)
Patient ID: Alfred Stephenson, male    DOB: 1950-12-16, 60 y.o.   MRN: [id number]  HPI 04/17/11- 60 year old male never smoker referred courtesy of Dr. Jarome Matin with concern of abnormal chest x-ray and history of sarcoid. Wife is here today. He was diagnosed with sarcoid by mediastinoscopy in 1970 when he presented with abnormal chest x-ray. He was never treated. A few months ago he had a chest cold. Chest x-ray showed a cloudy spot. On repeat he had 2 "cloudy spots", leading to a CT scan of the chest with contrast on 03/13/2011. There were multiple small noncalcified lung nodules throughout the upper lobes primarily. A 13 mm dominant nodule in the left upper lobe peripherally was identified consideration of CT scan followup. There were mediastinal and hilar adenopathy and multiple groundglass opacities in the upper lobes which might be areas of activity. Images were reviewed with them. He feels back to normal with the winter cough resolved. He denies sweats or fever, adenopathy, rash, chest pain or cough. There is no history of tuberculosis exposure PPD was negative in the past. 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 He denies history of asthma. He was diagnosed with obstructive sleep apnea and had a sleep study about a year ago with Dr.Dohmeier, but he never went back for followup and was not called. He had used CPAP but it is not working correctly because the mask leaks. He asks that I manage this problem. His wife says he snores loudly and he admits feeling very tired.  06/19/11- 60 year old male never smoker  with concern of abnormal chest x-ray/ multiple lung nodules,  and history of sarcoid, OSA.  Wife is here today. He denies fever sweats swollen glands or chest discomfort, cough or wheeze. He has lost some weight. His wife thinks he is more short of breath with exertion but he doesn't really notice it. He climbs stairs regularly at work. OSA- Has not been using CPAP regularly.  Mask leaks. Original study by Dr Vickey Huger. Wife complains of new problem- leg movements before and after he falls asleep. No punching or sleep walking. ACE 04/17/11- 53 PFT- 06/19/2011-within normal limits.   Review of Systems Per HPI Constitutional:   +weight loss, No- night sweats, fevers, chills, fatigue, lassitude. HEENT:   No-  headaches, difficulty swallowing, tooth/dental problems, sore throat,       No-  sneezing, itching, ear ache, nasal congestion, post nasal drip,  CV:  No-   chest pain, orthopnea, PND, swelling in lower extremities, anasarca, dizziness, palpitations Resp: Some shortness of breath with exertion or at rest.              No-   productive cough,  No non-productive cough,  No-  coughing up of blood.              No-   change in color of mucus.  No- wheezing.   Skin: No-   rash or lesions. GI:  No-   heartburn, indigestion, abdominal pain, nausea, vomiting, diarrhea,                 change in bowel habits, loss of appetite GU: No-   dysuria, change in color of urine, no urgency or frequency.  No- flank pain. MS:  No-   joint pain or swelling.  No- decreased range of motion.  No- back pain. Neuro- grossly normal to observation, Or:  Psych:  No- change in mood or affect. Some depression or anxiety.  No memory loss.      Objective:   Physical Exam General- Alert, Oriented, Affect-appropriate, Distress- none acute Skin- rash-none, lesions- none, excoriation- none Lymphadenopathy- none Head- atraumatic            Eyes- Gross vision intact, PERRLA, conjunctivae clear secretions            Ears- Hearing, canals-normal            Nose- Clear, no-Septal dev, mucus, polyps, erosion, perforation             Throat- Mallampati II , mucosa clear , drainage- none, tonsils- atrophic Neck- flexible , trachea midline, no stridor , thyroid nl, carotid no bruit Chest - symmetrical excursion , unlabored           Heart/CV- RRR , no murmur , no gallop  , no rub, nl s1 s2                            - JVD- none , edema- none, stasis changes- none, varices- none           Lung- clear to P&A, wheeze- none, cough- none , dullness-none, rub- none           Chest wall-  Abd- tender-no, distended-no, bowel sounds-present, HSM- no. Male pattern obesity Br/ Gen/ Rectal- Not done, not indicated Extrem- cyanosis- none, clubbing, none, atrophy- none, strength- nl Neuro- grossly intact to observation

## 2011-06-19 NOTE — Progress Notes (Signed)
PFT done today. 

## 2011-06-19 NOTE — Patient Instructions (Addendum)
Order- CT chest noncontrast- lung nodules  Suggest you ask [facility] about mask fit- take your whole rig so you can plug it in and show them where it leaks. You may be happier with a full face mask.  We will contact [facility] for your pressure setting, and contact Dr Dohmeier's office for a copy of your original diagnostic NPSG sleep study report

## 2011-06-23 DIAGNOSIS — G4769 Other sleep related movement disorders: Secondary | ICD-10-CM | POA: Insufficient documentation

## 2011-06-23 NOTE — Assessment & Plan Note (Deleted)
And this never smoker, stable small noncalcified nodules are quite consistent with his history of sarcoid. We can follow for change over time but he is not offering symptoms suggestive of active disease now.

## 2011-06-23 NOTE — Assessment & Plan Note (Signed)
In this never smoker, stable small noncalcified nodules are quite consistent with his history of sarcoid. We can follow for change over time but he is not offering symptoms suggestive of active disease now.

## 2011-06-23 NOTE — Assessment & Plan Note (Signed)
He is asking that I address his sleep apnea. We will contact [facility] for his pressure setting and get his old diagnostic sleep study for documentation.

## 2011-06-23 NOTE — Assessment & Plan Note (Signed)
History with that either periodic limb movement or restless leg syndrome but I favor the latter. We will try Requip after discussion.

## 2011-07-02 ENCOUNTER — Other Ambulatory Visit: Payer: 59

## 2011-07-03 ENCOUNTER — Encounter: Payer: Self-pay | Admitting: Internal Medicine

## 2011-07-06 ENCOUNTER — Ambulatory Visit (INDEPENDENT_AMBULATORY_CARE_PROVIDER_SITE_OTHER)
Admission: RE | Admit: 2011-07-06 | Discharge: 2011-07-06 | Disposition: A | Discharge status: Home / Self Care | Payer: 59 | Source: Ambulatory Visit | Attending: Internal Medicine | Admitting: Internal Medicine

## 2011-07-06 DIAGNOSIS — J984 Other disorders of lung: Secondary | ICD-10-CM

## 2011-07-06 DIAGNOSIS — R911 Solitary pulmonary nodule: Secondary | ICD-10-CM

## 2011-07-17 NOTE — Progress Notes (Signed)
Quick Note:  LMTCB ______ 

## 2011-07-17 NOTE — Progress Notes (Signed)
Quick Note:  Pt aware of results. ______ 

## 2011-07-26 ENCOUNTER — Encounter: Payer: Self-pay | Admitting: Internal Medicine

## 2011-11-15 DIAGNOSIS — N1832 Chronic kidney disease, stage 3b: Secondary | ICD-10-CM | POA: Insufficient documentation

## 2011-12-17 ENCOUNTER — Ambulatory Visit: Payer: 59 | Admitting: Internal Medicine

## 2012-06-12 ENCOUNTER — Other Ambulatory Visit: Payer: Self-pay | Admitting: Urology

## 2012-08-13 ENCOUNTER — Encounter (HOSPITAL_COMMUNITY): Payer: Self-pay | Admitting: Pharmacy Technician

## 2012-08-14 NOTE — Patient Instructions (Signed)
JASMON GRAFFAM  08/14/2012   Your procedure is scheduled on: 08/22/12   Report to [facility] at 0530 AM.  Call this number if you have problems the morning of surgery: [phone]   Remember:   Do not eat food or drink liquids after midnight.   Take these medicines the morning of surgery with A SIP OF WATER:    Do not wear jewelry, .  Do not wear lotions, powders, or perfumes.    . Men may shave face and neck.  Do not bring valuables to the hospital.  Contacts, dentures or bridgework may not be worn into surgery.     Patients discharged the day of surgery will not be allowed to drive home.  Name and phone number of your driver:   SEE CHG INSTRUCTION SHEET    Please read over the following fact sheets that you were given: MRSA Information, coughing and deep breathing exercises, leg exercises               Failure to comply with these instructions may result in cancellation of  Your surgery.                Patient Signature ____________________________             Nurse Signature _____________________________

## 2012-08-15 ENCOUNTER — Ambulatory Visit (HOSPITAL_COMMUNITY)
Admission: RE | Admit: 2012-08-15 | Discharge: 2012-08-15 | Disposition: A | Discharge status: Home / Self Care | Payer: 59 | Source: Ambulatory Visit | Attending: Urology | Admitting: Urology

## 2012-08-15 ENCOUNTER — Encounter (HOSPITAL_COMMUNITY): Payer: Self-pay

## 2012-08-15 ENCOUNTER — Encounter (HOSPITAL_COMMUNITY)
Admission: RE | Admit: 2012-08-15 | Discharge: 2012-08-15 | Disposition: A | Discharge status: Home / Self Care | Payer: 59 | Source: Ambulatory Visit | Attending: Urology | Admitting: Urology

## 2012-08-15 DIAGNOSIS — J984 Other disorders of lung: Secondary | ICD-10-CM | POA: Insufficient documentation

## 2012-08-15 DIAGNOSIS — N400 Enlarged prostate without lower urinary tract symptoms: Secondary | ICD-10-CM | POA: Insufficient documentation

## 2012-08-15 DIAGNOSIS — Z01810 Encounter for preprocedural cardiovascular examination: Secondary | ICD-10-CM | POA: Insufficient documentation

## 2012-08-15 DIAGNOSIS — Z01812 Encounter for preprocedural laboratory examination: Secondary | ICD-10-CM | POA: Insufficient documentation

## 2012-08-15 DIAGNOSIS — I10 Essential (primary) hypertension: Secondary | ICD-10-CM | POA: Insufficient documentation

## 2012-08-15 HISTORY — DX: Unspecified osteoarthritis, unspecified site: M19.90

## 2012-08-15 HISTORY — DX: Anxiety disorder, unspecified: F41.9

## 2012-08-15 LAB — BASIC METABOLIC PANEL
Chloride: 101 mEq/L (ref 96–112)
Creatinine, Ser: 1.32 mg/dL (ref 0.50–1.35)
GFR calc Af Amer: 65 mL/min — ABNORMAL LOW (ref >90)

## 2012-08-15 LAB — CBC
MCV: 89.2 fL (ref 78.0–100.0)
Platelets: 229 10*3/uL (ref 150–400)
RDW: 12.8 % (ref 11.5–15.5)
WBC: 5 10*3/uL (ref 4.0–10.5)

## 2012-08-15 LAB — SURGICAL PCR SCREEN
MRSA, PCR: NEGATIVE
Staphylococcus aureus: NEGATIVE

## 2012-08-18 NOTE — H&P (Signed)
Urology Admission H&P   History of Present Illness: 62 yo with BPH, BOO and bothersome LUTS. He presents today for Greenlight PVP.   Past Medical History  Diagnosis Date  . Hypertension   . Anxiety   . Sleep apnea     cpap does not know settings   . Arthritis     arthritis left index finger    Past Surgical History  Procedure Date  . Lung biopsy 1970  . Neck surgery 2007  . Back surgery 2007  . Nasal sinus surgery 2006    Home Medications:  No prescriptions prior to admission   Allergies: No Known Allergies  No family history on file. Social History:  reports that he has never smoked. He has never used smokeless tobacco. He reports that he does not drink alcohol or use illicit drugs.  Review of Systems  All other systems reviewed and are negative.    Physical Exam:  Vital signs in last 24 hours:   Physical Exam  Vitals reviewed. Constitutional: He is oriented to person, place, and time. He appears well-developed and well-nourished.  HENT:  Head: Normocephalic.  Eyes: Conjunctivae normal are normal. Pupils are equal, round, and reactive to light.  Cardiovascular: Normal rate and regular rhythm.   Respiratory: Effort normal.  GI: Soft. Bowel sounds are normal.  Neurological: He is alert and oriented to person, place, and time.  Skin: Skin is warm and dry.  Psychiatric: He has a normal mood and affect.    Laboratory Data:  No results found for this or any previous visit (from the past 24 hour(s)). Recent Results (from the past 240 hour(s))  SURGICAL PCR SCREEN     Status: Normal   Collection Time   08/15/12 11:20 AM      Component Value Range Status Comment   MRSA, PCR NEGATIVE  NEGATIVE Final    Staphylococcus aureus NEGATIVE  NEGATIVE Final    Creatinine:  Basename 08/15/12 1150  CREATININE 1.32    Impression/Assessment:  BPH BOO Stream smaller Urinary urgency  Plan:  I discussed with the patient the nature, potential benefits, risks and  alternatives to Greenlight PVP, including side effects of the proposed treatment, the likelihood of the patient achieving the goals of the procedure, and any potential problems that might occur during the procedure or recuperation. Among other risks we discussed retrograde ejaculation andworsening irritative LUTS (frequency, urgency dysuria) which could be temporary or permanent. All questions answered. Patient elects to proceed.   Antony Haste

## 2012-08-22 ENCOUNTER — Ambulatory Visit (HOSPITAL_COMMUNITY)
Admission: RE | Admit: 2012-08-22 | Discharge: 2012-08-22 | Disposition: A | Discharge status: Home / Self Care | Payer: 59 | Source: Ambulatory Visit | Attending: Urology | Admitting: Urology

## 2012-08-22 ENCOUNTER — Ambulatory Visit (HOSPITAL_COMMUNITY): Payer: 59 | Admitting: Anesthesiology

## 2012-08-22 ENCOUNTER — Encounter (HOSPITAL_COMMUNITY): Payer: Self-pay | Admitting: Anesthesiology

## 2012-08-22 ENCOUNTER — Encounter (HOSPITAL_COMMUNITY)
Admission: RE | Disposition: A | Discharge status: Home / Self Care | Payer: Self-pay | Source: Ambulatory Visit | Attending: Urology

## 2012-08-22 ENCOUNTER — Encounter (HOSPITAL_COMMUNITY): Payer: Self-pay

## 2012-08-22 DIAGNOSIS — I10 Essential (primary) hypertension: Secondary | ICD-10-CM | POA: Insufficient documentation

## 2012-08-22 DIAGNOSIS — G4730 Sleep apnea, unspecified: Secondary | ICD-10-CM | POA: Insufficient documentation

## 2012-08-22 DIAGNOSIS — N401 Enlarged prostate with lower urinary tract symptoms: Secondary | ICD-10-CM | POA: Insufficient documentation

## 2012-08-22 DIAGNOSIS — N320 Bladder-neck obstruction: Secondary | ICD-10-CM | POA: Insufficient documentation

## 2012-08-22 DIAGNOSIS — R3915 Urgency of urination: Secondary | ICD-10-CM | POA: Insufficient documentation

## 2012-08-22 DIAGNOSIS — N138 Other obstructive and reflux uropathy: Secondary | ICD-10-CM | POA: Insufficient documentation

## 2012-08-22 HISTORY — PX: GREEN LIGHT LASER TURP (TRANSURETHRAL RESECTION OF PROSTATE: SHX6260

## 2012-08-22 SURGERY — GREEN LIGHT LASER TURP (TRANSURETHRAL RESECTION OF PROSTATE
Anesthesia: General | Site: Prostate | Wound class: Clean Contaminated

## 2012-08-22 MED ORDER — PHENYLEPHRINE HCL 10 MG/ML IJ SOLN
INTRAMUSCULAR | Status: DC | PRN
  Administered 2012-08-22 (×2): 80 ug via INTRAVENOUS
  Administered 2012-08-22: 40 ug via INTRAVENOUS
  Administered 2012-08-22 (×2): 80 ug via INTRAVENOUS
  Administered 2012-08-22: 40 ug via INTRAVENOUS

## 2012-08-22 MED ORDER — CIPROFLOXACIN HCL 250 MG PO TABS: 500.0000 mg | ORAL_TABLET | Freq: Two times a day (BID) | ORAL | Status: DC

## 2012-08-22 MED ORDER — BELLADONNA ALKALOIDS-OPIUM 16.2-60 MG RE SUPP
RECTAL | Status: DC | PRN
  Administered 2012-08-22: 1 via RECTAL

## 2012-08-22 MED ORDER — FENTANYL CITRATE 0.05 MG/ML IJ SOLN
INTRAMUSCULAR | Status: DC | PRN
  Administered 2012-08-22: 50 ug via INTRAVENOUS
  Administered 2012-08-22: 100 ug via INTRAVENOUS
  Administered 2012-08-22: 25 ug via INTRAVENOUS

## 2012-08-22 MED ORDER — OXYCODONE-ACETAMINOPHEN 5-325 MG PO TABS: 1.0000 | ORAL_TABLET | ORAL | Status: DC | PRN

## 2012-08-22 MED ORDER — LIDOCAINE HCL 2 % EX GEL
CUTANEOUS | Status: AC
  Filled 2012-08-22: qty 10

## 2012-08-22 MED ORDER — CEFAZOLIN SODIUM-DEXTROSE 2-3 GM-% IV SOLR
2.0000 g | INTRAVENOUS | Status: AC
  Administered 2012-08-22: 2 g via INTRAVENOUS

## 2012-08-22 MED ORDER — CEFAZOLIN SODIUM-DEXTROSE 2-3 GM-% IV SOLR
INTRAVENOUS | Status: AC
  Filled 2012-08-22: qty 50

## 2012-08-22 MED ORDER — LACTATED RINGERS IV SOLN
INTRAVENOUS | Status: DC | PRN
  Administered 2012-08-22 (×3): via INTRAVENOUS

## 2012-08-22 MED ORDER — LIDOCAINE HCL (CARDIAC) 20 MG/ML IV SOLN
INTRAVENOUS | Status: DC | PRN
  Administered 2012-08-22: 100 mg via INTRAVENOUS

## 2012-08-22 MED ORDER — AMLODIPINE BESYLATE 10 MG PO TABS
10.0000 mg | ORAL_TABLET | Freq: Once | ORAL | Status: AC
  Administered 2012-08-22: 10 mg via ORAL
  Filled 2012-08-22: qty 1

## 2012-08-22 MED ORDER — HYOSCYAMINE SULFATE 0.125 MG SL SUBL: 0.1250 mg | SUBLINGUAL_TABLET | SUBLINGUAL | Status: DC | PRN

## 2012-08-22 MED ORDER — LIDOCAINE HCL 2 % EX GEL
CUTANEOUS | Status: DC | PRN
  Administered 2012-08-22: 1 via URETHRAL

## 2012-08-22 MED ORDER — ASPIRIN 81 MG PO TABS: 81.0000 mg | ORAL_TABLET | Freq: Every morning | ORAL | Status: DC

## 2012-08-22 MED ORDER — PROPOFOL 10 MG/ML IV BOLUS
INTRAVENOUS | Status: DC | PRN
  Administered 2012-08-22: 200 mg via INTRAVENOUS

## 2012-08-22 MED ORDER — KETOROLAC TROMETHAMINE 30 MG/ML IJ SOLN: 15.0000 mg | Freq: Once | INTRAMUSCULAR | Status: DC | PRN

## 2012-08-22 MED ORDER — ONDANSETRON HCL 4 MG/2ML IJ SOLN
INTRAMUSCULAR | Status: DC | PRN
  Administered 2012-08-22: 4 mg via INTRAVENOUS

## 2012-08-22 MED ORDER — ACETAMINOPHEN 10 MG/ML IV SOLN
INTRAVENOUS | Status: AC
  Filled 2012-08-22: qty 100

## 2012-08-22 MED ORDER — MIDAZOLAM HCL 5 MG/5ML IJ SOLN
INTRAMUSCULAR | Status: DC | PRN
  Administered 2012-08-22: 2 mg via INTRAVENOUS

## 2012-08-22 MED ORDER — BELLADONNA ALKALOIDS-OPIUM 16.2-60 MG RE SUPP
RECTAL | Status: AC
  Filled 2012-08-22: qty 1

## 2012-08-22 MED ORDER — FENTANYL CITRATE 0.05 MG/ML IJ SOLN: 25.0000 ug | INTRAMUSCULAR | Status: DC | PRN

## 2012-08-22 MED ORDER — CEFAZOLIN SODIUM 1-5 GM-% IV SOLN: 1.0000 g | INTRAVENOUS | Status: DC

## 2012-08-22 MED ORDER — ACETAMINOPHEN 10 MG/ML IV SOLN
INTRAVENOUS | Status: DC | PRN
  Administered 2012-08-22: 1000 mg via INTRAVENOUS

## 2012-08-22 MED ORDER — PROMETHAZINE HCL 25 MG/ML IJ SOLN: 6.2500 mg | INTRAMUSCULAR | Status: DC | PRN

## 2012-08-22 MED ORDER — SODIUM CHLORIDE 0.9 % IR SOLN
Status: DC | PRN
  Administered 2012-08-22: 18000 mL via INTRAVESICAL

## 2012-08-22 MED ORDER — PHENAZOPYRIDINE HCL 200 MG PO TABS: 200.0000 mg | ORAL_TABLET | Freq: Three times a day (TID) | ORAL | Status: DC | PRN

## 2012-08-22 MED ORDER — EPHEDRINE SULFATE 50 MG/ML IJ SOLN
INTRAMUSCULAR | Status: DC | PRN
  Administered 2012-08-22: 10 mg via INTRAVENOUS
  Administered 2012-08-22: 5 mg via INTRAVENOUS
  Administered 2012-08-22: 15 mg via INTRAVENOUS

## 2012-08-22 MED ORDER — DEXAMETHASONE SODIUM PHOSPHATE 10 MG/ML IJ SOLN
INTRAMUSCULAR | Status: DC | PRN
  Administered 2012-08-22: 10 mg via INTRAVENOUS

## 2012-08-22 SURGICAL SUPPLY — 22 items
BAG URINE DRAINAGE (UROLOGICAL SUPPLIES) ×2 IMPLANT
BAG URO CATCHER STRL LF (DRAPE) ×2 IMPLANT
CATH FOLEY 2WAY SLVR  5CC 18FR (CATHETERS)
CATH FOLEY 2WAY SLVR 5CC 18FR (CATHETERS) IMPLANT
CATH TIEMANN FOLEY 18FR 5CC (CATHETERS) ×1 IMPLANT
CLOTH BEACON ORANGE TIMEOUT ST (SAFETY) ×2 IMPLANT
DRAPE CAMERA CLOSED 9X96 (DRAPES) ×2 IMPLANT
ELECT BUTTON HF 24-28F 2 30DE (ELECTRODE) IMPLANT
ELECT LOOP MED HF 24F 12D (CUTTING LOOP) ×1 IMPLANT
ELECT LOOP MED HF 24F 12D CBL (CLIP) IMPLANT
ELECT RESECT VAPORIZE 12D CBL (ELECTRODE) IMPLANT
FEE RENTAL LASER GREENLIGHT (Laser) IMPLANT
GLOVE BIOGEL M STRL SZ7.5 (GLOVE) ×2 IMPLANT
GOWN STRL REIN XL XLG (GOWN DISPOSABLE) ×2 IMPLANT
HOLDER FOLEY CATH W/STRAP (MISCELLANEOUS) IMPLANT
LASER FIBER /GREENLIGHT LASER (Laser) ×1 IMPLANT
LASER GREENLIGHT RENTAL P/PROC (Laser) ×2 IMPLANT
MANIFOLD NEPTUNE II (INSTRUMENTS) ×2 IMPLANT
PACK CYSTO (CUSTOM PROCEDURE TRAY) ×2 IMPLANT
SYR 30ML LL (SYRINGE) IMPLANT
SYRINGE IRR TOOMEY STRL 70CC (SYRINGE) IMPLANT
TUBING CONNECTING 10 (TUBING) ×2 IMPLANT

## 2012-08-22 NOTE — Anesthesia Preprocedure Evaluation (Addendum)
Anesthesia Evaluation  Patient identified by MRN, date of birth, ID band Patient awake    Reviewed: Allergy & Precautions, H&P , NPO status , Patient's Chart, lab work & pertinent test results  Airway Mallampati: III TM Distance: <3 FB Neck ROM: Full    Dental  (+) Chipped and Dental Advisory Given   Pulmonary sleep apnea and Continuous Positive Airway Pressure Ventilation ,  breath sounds clear to auscultation  Pulmonary exam normal       Cardiovascular hypertension, Pt. on medications Rhythm:Regular Rate:Normal     Neuro/Psych negative neurological ROS  negative psych ROS   GI/Hepatic negative GI ROS, Neg liver ROS,   Endo/Other  Morbid obesity  Renal/GU negative Renal ROS  negative genitourinary   Musculoskeletal negative musculoskeletal ROS (+)   Abdominal   Peds negative pediatric ROS (+)  Hematology negative hematology ROS (+)   Anesthesia Other Findings   Reproductive/Obstetrics negative OB ROS                          Anesthesia Physical Anesthesia Plan  ASA: III  Anesthesia Plan: General   Post-op Pain Management:    Induction: Intravenous  Airway Management Planned: LMA  Additional Equipment:   Intra-op Plan:   Post-operative Plan:   Informed Consent: I have reviewed the patients History and Physical, chart, labs and discussed the procedure including the risks, benefits and alternatives for the proposed anesthesia with the patient or authorized representative who has indicated his/her understanding and acceptance.   Dental advisory given  Plan Discussed with: CRNA and Surgeon  Anesthesia Plan Comments:         Anesthesia Quick Evaluation

## 2012-08-22 NOTE — Op Note (Signed)
Preoperative diagnosis: BPH, urinary stream smaller, urinary straining and hesitancy Postoperative diagnosis: Same Procedure: Exam under anesthesia Greenlight photo vaporization of the prostate  Surgeon: Mena Goes  Anesthesiologist: Rose  Type of anesthesia: Gen.  Findings: On exam under anesthesia digital rectal exam revealed the prostate was symmetrically enlarged and smooth without hard area or nodule. The testicles were descended without mass or hard area and were palpably normal. The penis was normal without lesion. There was a sebaceous cyst on the left hemiscrotum.  On ureteroscopy the urethra appeared normal, there was lateral lobe hypertrophy the prostate with an elongated prostatic urethra. The left lateral lobe was greater in the right. There was no median lobe. The bladder was mildly trabeculated in the trigone and ureteral orifice these were in their normal orthotopic position. There were no stones or tumors in the bladder.  Description of procedure: After consent was obtained patient brought to the operating room. After adequate anesthesia he is placed in lithotomy position. A timeout was performed to confirm the patient and procedure. An exam under anesthesia was performed in a B&O suppository placed. The 28 French resectoscope sheath was placed with the laser bridge without difficulty. The bladder examined. I began the laser and vaporize the prostatic tissue from the bladder neck anterior to posterior, bladder neck to veru on the left lateral lobe. In a similar fashion the right lateral lobe was vaporized. This created a good channel. I then decreased the laser energy and vaporize some of the bladder neck and posteriorly to complete the vaporization of the very posterior the lateral lobes. There was really no median lobe tissue and I did not laser posteriorly along the ejaculatory ducts. Having vaporize the lateral lobes this created a good channel no photo was taken. Hemostasis was  excellent. An 28 French Foley coud catheter was placed with the balloon and seated at the bladder neck and left gravity drainage. The urine was clear. He was awakened taken to room in stable condition.  Complications: None  Blood loss: Minimal  Specimens: None  Drains: 18 French Foley  Disposition: Patient stable to PACU

## 2012-08-22 NOTE — Progress Notes (Signed)
Foley care to include emptying, removal and securing taught to pt and wife.  Demonstrated back.

## 2012-08-22 NOTE — Progress Notes (Signed)
PACU NOTE:  Dr Mena Goes aware urine pink/red.  No new orders

## 2012-08-22 NOTE — Anesthesia Postprocedure Evaluation (Signed)
  Anesthesia Post-op Note  Patient: Alfred Stephenson  Procedure(s) Performed: Procedure(s) (LRB): GREEN LIGHT LASER TURP (TRANSURETHRAL RESECTION OF PROSTATE (N/A)  Patient Location: PACU  Anesthesia Type: General  Level of Consciousness: awake and alert   Airway and Oxygen Therapy: Patient Spontanous Breathing  Post-op Pain: mild  Post-op Assessment: Post-op Vital signs reviewed, Patient's Cardiovascular Status Stable, Respiratory Function Stable, Patent Airway and No signs of Nausea or vomiting  Last Vitals:  Filed Vitals:   08/22/12 0945  BP: 90/65  Pulse: 68  Temp: 36.7 C  Resp: 16    Post-op Vital Signs: stable   Complications: No apparent anesthesia complications

## 2012-08-22 NOTE — Transfer of Care (Signed)
Immediate Anesthesia Transfer of Care Note  Patient: Alfred Stephenson  Procedure(s) Performed: Procedure(s) (LRB) with comments: GREEN LIGHT LASER TURP (TRANSURETHRAL RESECTION OF PROSTATE (N/A) -     Patient Location: PACU  Anesthesia Type:General  Level of Consciousness: sedated  Airway & Oxygen Therapy: Patient Spontanous Breathing and Patient connected to face mask oxygen  Post-op Assessment: Report given to PACU RN and Post -op Vital signs reviewed and stable  Post vital signs: Reviewed and stable  Complications: No apparent anesthesia complications

## 2012-08-25 ENCOUNTER — Encounter (HOSPITAL_COMMUNITY): Payer: Self-pay | Admitting: Urology

## 2013-03-05 DIAGNOSIS — H9190 Unspecified hearing loss, unspecified ear: Secondary | ICD-10-CM | POA: Insufficient documentation

## 2013-07-05 ENCOUNTER — Encounter (HOSPITAL_COMMUNITY): Payer: Self-pay | Admitting: Emergency Medicine

## 2013-07-05 ENCOUNTER — Emergency Department (HOSPITAL_COMMUNITY)
Admission: EM | Admit: 2013-07-05 | Discharge: 2013-07-06 | Disposition: A | Discharge status: Home / Self Care | Payer: 59 | Attending: Emergency Medicine | Admitting: Emergency Medicine

## 2013-07-05 ENCOUNTER — Emergency Department (HOSPITAL_COMMUNITY): Payer: 59

## 2013-07-05 DIAGNOSIS — S0993XA Unspecified injury of face, initial encounter: Secondary | ICD-10-CM | POA: Insufficient documentation

## 2013-07-05 DIAGNOSIS — Z8739 Personal history of other diseases of the musculoskeletal system and connective tissue: Secondary | ICD-10-CM | POA: Insufficient documentation

## 2013-07-05 DIAGNOSIS — S0990XA Unspecified injury of head, initial encounter: Secondary | ICD-10-CM | POA: Insufficient documentation

## 2013-07-05 DIAGNOSIS — Y9389 Activity, other specified: Secondary | ICD-10-CM | POA: Insufficient documentation

## 2013-07-05 DIAGNOSIS — Z79899 Other long term (current) drug therapy: Secondary | ICD-10-CM | POA: Insufficient documentation

## 2013-07-05 DIAGNOSIS — I10 Essential (primary) hypertension: Secondary | ICD-10-CM | POA: Insufficient documentation

## 2013-07-05 DIAGNOSIS — T148XXA Other injury of unspecified body region, initial encounter: Secondary | ICD-10-CM

## 2013-07-05 DIAGNOSIS — IMO0002 Reserved for concepts with insufficient information to code with codable children: Secondary | ICD-10-CM | POA: Insufficient documentation

## 2013-07-05 DIAGNOSIS — Z7982 Long term (current) use of aspirin: Secondary | ICD-10-CM | POA: Insufficient documentation

## 2013-07-05 DIAGNOSIS — Y92410 Unspecified street and highway as the place of occurrence of the external cause: Secondary | ICD-10-CM | POA: Insufficient documentation

## 2013-07-05 MED ORDER — NAPROXEN 500 MG PO TABS: 500.0000 mg | ORAL_TABLET | Freq: Two times a day (BID) | ORAL | Status: DC

## 2013-07-05 MED ORDER — CYCLOBENZAPRINE HCL 10 MG PO TABS: 10.0000 mg | ORAL_TABLET | Freq: Three times a day (TID) | ORAL | Status: DC | PRN
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
## 2013-07-06 NOTE — ED Provider Notes (Signed)
Medical screening examination/treatment/procedure(s) were performed by non-physician practitioner and as supervising physician I was immediately available for consultation/collaboration.  EKG Interpretation   None        Ravin Denardo R. Layken Beg, MD 07/06/13 0003 

## 2013-11-05 ENCOUNTER — Ambulatory Visit (INDEPENDENT_AMBULATORY_CARE_PROVIDER_SITE_OTHER): Payer: 59 | Admitting: Cardiology

## 2013-11-05 ENCOUNTER — Encounter: Payer: Self-pay | Admitting: Cardiology

## 2013-11-05 VITALS — BP 135/85 | HR 91 | Ht 71.0 in | Wt 186.0 lb

## 2013-11-05 DIAGNOSIS — R0609 Other forms of dyspnea: Secondary | ICD-10-CM

## 2013-11-05 DIAGNOSIS — I10 Essential (primary) hypertension: Secondary | ICD-10-CM

## 2013-11-05 DIAGNOSIS — R0600 Dyspnea, unspecified: Secondary | ICD-10-CM

## 2013-11-05 DIAGNOSIS — R0989 Other specified symptoms and signs involving the circulatory and respiratory systems: Secondary | ICD-10-CM

## 2013-11-05 DIAGNOSIS — I4510 Unspecified right bundle-branch block: Secondary | ICD-10-CM
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
## 2013-11-05 NOTE — Patient Instructions (Signed)
Your physician recommends that you continue on your current medications as directed. Please refer to the Current Medication list given to you today.  Your physician has requested that you have an exercise tolerance test. For further information please visit [email or web address]. Please also follow instruction sheet, as given.  Your physician has requested that you have an echocardiogram. Echocardiography is a painless test that uses sound waves to create images of your heart. It provides your doctor with information about the size and shape of your heart and how well your heart's chambers and valves are working. This procedure takes approximately one hour. There are no restrictions for this procedure.  Your physician recommends that you schedule a follow-up as needed. 

## 2013-12-14 ENCOUNTER — Ambulatory Visit (HOSPITAL_COMMUNITY): Payer: 59 | Attending: Cardiology | Admitting: Radiology

## 2013-12-14 ENCOUNTER — Encounter (INDEPENDENT_AMBULATORY_CARE_PROVIDER_SITE_OTHER): Payer: Self-pay

## 2013-12-14 ENCOUNTER — Ambulatory Visit (INDEPENDENT_AMBULATORY_CARE_PROVIDER_SITE_OTHER): Payer: 59 | Admitting: Physician Assistant

## 2013-12-14 DIAGNOSIS — R0609 Other forms of dyspnea: Secondary | ICD-10-CM | POA: Insufficient documentation

## 2013-12-14 DIAGNOSIS — R0989 Other specified symptoms and signs involving the circulatory and respiratory systems: Principal | ICD-10-CM | POA: Insufficient documentation

## 2013-12-14 DIAGNOSIS — R0602 Shortness of breath: Secondary | ICD-10-CM

## 2013-12-14 NOTE — Progress Notes (Signed)
Echocardiogram performed.  

## 2013-12-14 NOTE — Progress Notes (Signed)
Exercise Treadmill Test  Pre-Exercise Testing Evaluation Rhythm: normal sinus  Rate: 63     Test  Exercise Tolerance Test Ordering MD: Donato SchultzMark Skains, MD  Interpreting MD: Tereso NewcomerScott Clarabel Marion, PA-C  Unique Test No: 1  Treadmill:  1  Indication for ETT: DOE  Contraindication to ETT: No   Stress Modality: exercise - treadmill  Cardiac Imaging Performed: non   Protocol: standard Bruce - maximal  Max BP:  168/71  Max MPHR (bpm):  156 85% MPR (bpm):  133  MPHR obtained (bpm):  142 % MPHR obtained:  91  Reached 85% MPHR (min:sec):  2:58 Total Exercise Time (min-sec):  7:00  Workload in METS:  8.5 Borg Scale: 15  Reason ETT Terminated:  patient's desire to stop    ST Segment Analysis At Rest: non-specific ST segment slurring - RBBB With Exercise: non-specific ST changes - RBBB  Other Information Arrhythmia:  No Angina during ETT:  absent (0) Quality of ETT:  diagnostic  ETT Interpretation:  normal - no evidence of ischemia by ST analysis in setting of baseline RBBB  Comments: Fair exercise capacity. No chest pain. He did c/o dyspnea. Normal BP response to exercise. There is baseline RBBB.  No significant ST changes from baseline to suggest ischemia. O2 sats 98% during exercise.   Recommendations: Consider f/u with pulmonology for sarcoidosis. F/u with Dr. Donato SchultzMark Skains as planned. Signed,  Tereso NewcomerScott Eh Sesay, PA-C   12/14/2013 11:36 AM

## 2014-05-03 ENCOUNTER — Ambulatory Visit: Payer: 59 | Admitting: Internal Medicine

## 2014-07-16 ENCOUNTER — Ambulatory Visit: Payer: 59 | Admitting: Internal Medicine

## 2014-07-22 ENCOUNTER — Ambulatory Visit: Payer: 59 | Admitting: Internal Medicine

## 2015-06-03 DIAGNOSIS — R3911 Hesitancy of micturition: Secondary | ICD-10-CM | POA: Insufficient documentation

## 2015-06-03 DIAGNOSIS — L989 Disorder of the skin and subcutaneous tissue, unspecified: Secondary | ICD-10-CM | POA: Insufficient documentation

## 2016-05-08 DIAGNOSIS — E291 Testicular hypofunction: Secondary | ICD-10-CM | POA: Insufficient documentation

## 2016-12-06 ENCOUNTER — Institutional Professional Consult (permissible substitution): Payer: Medicare Other | Admitting: Neurology

## 2017-06-10 ENCOUNTER — Encounter: Payer: Self-pay | Admitting: Cardiology

## 2017-06-10 ENCOUNTER — Ambulatory Visit (INDEPENDENT_AMBULATORY_CARE_PROVIDER_SITE_OTHER): Payer: Medicare Other | Admitting: Cardiology

## 2017-06-10 VITALS — BP 118/72 | HR 97 | Ht 71.0 in | Wt 215.0 lb

## 2017-06-10 DIAGNOSIS — I4510 Unspecified right bundle-branch block: Secondary | ICD-10-CM | POA: Insufficient documentation

## 2017-06-10 DIAGNOSIS — R0609 Other forms of dyspnea: Secondary | ICD-10-CM | POA: Diagnosis not present

## 2017-06-10 DIAGNOSIS — D869 Sarcoidosis, unspecified: Secondary | ICD-10-CM

## 2017-06-10 DIAGNOSIS — E785 Hyperlipidemia, unspecified: Secondary | ICD-10-CM | POA: Diagnosis not present

## 2017-06-10 DIAGNOSIS — R42 Dizziness and giddiness: Secondary | ICD-10-CM | POA: Insufficient documentation

## 2017-06-10 DIAGNOSIS — G4733 Obstructive sleep apnea (adult) (pediatric): Secondary | ICD-10-CM

## 2017-06-10 NOTE — Progress Notes (Signed)
Cardiology Consultation:    Date:  06/10/2017   ID:  Alfred Stephenson, DOB 1950-12-30, MRN [id number]  PCP:  Jarome Matin, MD  Cardiologist:  Gypsy Balsam, MD   Referring MD: Jarome Matin, MD   Chief Complaint  Patient presents with  . Establish Care    pt states he had a cardiologist in [place] area that moved to [place]. He states it has been 20 or more years since he has been seen with cardio. Pt has no concerns other than SHOB on exertion  I am doing well but want to be checked  History of Present Illness:    Alfred Stephenson is a 66 y.o. male who is being seen today for the evaluation of heart issues at the request of Jarome Matin, MD.  Overall he is doing well but described to have some worsening shortness of breath.  There is a gradual process happening within the last few years.  There is no chest pain tightness squeezing pressure burning chest.  Apparently 20 years ago he saw some cardiologist stress test was done which was abnormal and then cardiac catheterization he did not required in the intervention and he was told that everything is fine.  He does have right bundle branch block which apparently is chronic.  And this is what triggered workup 20 years ago.  New complaint that he had his progressive worsening of shortness of breath.  Denies having any tightness squeezing pressure burning in the chest.  He is still very active and works a lot in the garden outside he enjoyed this tremendously.  Sometimes get tired and short of breath but no chest pain.  Described to have some dizziness that happens especially when he gets up very quickly.  When he sits or walks to work there is no dizziness.  No syncope no palpitations no swelling of lower extremities.  He does have difficulty sleeping and was diagnosed with sleep apnea years ago wear CPAP mask however that was discontinued by him for no unclear reason he was recently referred back to sleep lab to reestablish and  reinitiated the treatment.  Past Medical History:  Diagnosis Date  . Anxiety   . Arthritis    arthritis left index finger   . Chronic kidney disease   . Hyperlipidemia   . Hypertension   . Sarcoidosis   . Sleep apnea    cpap does not know settings     Past Surgical History:  Procedure Laterality Date  . BACK SURGERY  2007  . CARDIAC CATHETERIZATION    . LUNG BIOPSY  1970  . NASAL SINUS SURGERY  2006  . NECK SURGERY  2007    Current Medications: Current Meds  Medication Sig  . amLODipine (NORVASC) 5 MG tablet Take 5 mg by mouth daily.  [name] aspirin EC 81 MG tablet Take 81 mg by mouth daily.  [name] olmesartan (BENICAR) 40 MG tablet Take 40 mg by mouth daily.  [name] oxybutynin (DITROPAN XL) 15 MG 24 hr tablet Take 15 mg by mouth at bedtime.  . pravastatin (PRAVACHOL) 40 MG tablet Take 40 mg by mouth daily.  [name] PRISTIQ 100 MG 24 hr tablet Take 100 mg by mouth every morning.      Allergies:   Patient has no known allergies.   Social History   Socioeconomic History  . Marital status: Married    Spouse name: None  . Number of children: None  . Years of education: None  . Highest education  level: None  Social Needs  . Financial resource strain: None  . Food insecurity - worry: None  . Food insecurity - inability: None  . Transportation needs - medical: None  . Transportation needs - non-medical: None  Occupational History  . 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 Smoking status: Never Smoker  . Smokeless tobacco: Never Used  Substance and Sexual Activity  . Alcohol use: No  . Drug use: No  . Sexual activity: None  Other Topics Concern  . None  Social History Narrative  . None     Family History: The patient's family history includes CAD in his father; Heart disease in his father; Stroke in his father. ROS:   Please see the history of present illness.    All 14 point review of systems negative except as described per history of present  illness.  EKGs/Labs/Other Studies Reviewed:    The following studies were reviewed today: Normal sinus rhythm, normal intervals, right bundle branch block    Recent Labs: No results found for requested labs within last 8760 hours.  Recent Lipid Panel No results found for: CHOL, TRIG, HDL, CHOLHDL, VLDL, LDLCALC, LDLDIRECT  Physical Exam:    VS:  BP 118/72 (BP Location: Right Arm, Patient Position: Sitting, Cuff Size: Normal)   Pulse 97   Ht 5\' 11"  (1.803 m)   Wt 215 lb (97.5 kg)   BMI 29.99 kg/m     Wt Readings from Last 3 Encounters:  06/10/17 215 lb (97.5 kg)  11/05/13 186 lb (84.4 kg)  07/05/13 225 lb (102.1 kg)     GEN:  Well nourished, well developed in no acute distress HEENT: Normal NECK: No JVD; No carotid bruits LYMPHATICS: No lymphadenopathy CARDIAC: RRR, no murmurs, no rubs, no gallops RESPIRATORY:  Clear to auscultation without rales, wheezing or rhonchi  ABDOMEN: Soft, non-tender, non-distended MUSCULOSKELETAL:  No edema; No deformity  SKIN: Warm and dry NEUROLOGIC:  Alert and oriented x 3 PSYCHIATRIC:  Normal affect   ASSESSMENT:    1. Obstructive sleep apnea   2. Sarcoid   3. Right bundle branch block   4. Dyspnea on exertion   5. Dyslipidemia   6. Dizziness    PLAN:    In order of problems listed above:  1. Obstructive sleep apnea: He is scheduled already to have sleep study which clearly will be beneficial. 2. Sarcoid: Apparently followed by pulmonary but he did not see anybody recently he may need to be reestablished as a patient. 3. Right bundle branch block apparently chronic issue.  I will ask him to have echocardiogram be especially interested in looking the right ventricle size and function.  Pulmonary artery pressure need to be assessed as well.  I suspect right bundle branch block could be caused by sleep apnea. 4. Dyslipidemia: His LDL is 106 on pravastatin.  I would favor to see the numbers less than 100 but this is something going  to talk about in the future.  He is planning to jump [employer] and start exercising on a regular basis which should help with cholesterol. 5. Dizziness: It looks like orthostatic.  However since he does have sarcoidosis I will ask him to wear a Holter monitor for 24 hours.  His echocardiogram will check his function and potential presence of granulomas.   Medication Adjustments/Labs and Tests Ordered: Current medicines are reviewed at length with the patient today.  Concerns regarding medicines are outlined above.  No orders of the defined types were placed  in this encounter.  No orders of the defined types were placed in this encounter.   Signed, Georgeanna Leaobert J. Krasowski, MD, [facility] Hospital[facility]ACC. 06/10/2017 11:22 AM    [facility]

## 2017-06-10 NOTE — Patient Instructions (Addendum)
Medication Instructions:  Your physician recommends that you continue on your current medications as directed. Please refer to the Current Medication list given to you today.  Labwork: None   Testing/Procedures: EKG today in office.   Your physician has recommended that you wear a holter monitor. Holter monitors are medical devices that record the heart's electrical activity. Doctors most often use these monitors to diagnose arrhythmias. Arrhythmias are problems with the speed or rhythm of the heartbeat. The monitor is a small, portable device. You can wear one while you do your normal daily activities. This is usually used to diagnose what is causing palpitations/syncope (passing out).  Holter Monitoring A Holter monitor is a small device that is used to detect abnormal heart rhythms. It clips to your clothing and is connected by wires to flat, sticky disks (electrodes) that attach to your chest. It is worn continuously for 24-48 hours. Follow these instructions at home:  Wear your Holter monitor at all times, even while exercising and sleeping, for as long as directed by your health care provider.  Make sure that the Holter monitor is safely clipped to your clothing or close to your body as recommended by your health care provider.  Do not get the monitor or wires wet.  Do not put body lotion or moisturizer on your chest.  Keep your skin clean.  Keep a diary of your daily activities, such as walking and doing chores. If you feel that your heartbeat is abnormal or that your heart is fluttering or skipping a beat: ? Record what you are doing when it happens. ? Record what time of day the symptoms occur.  Return your Holter monitor as directed by your health care provider.  Keep all follow-up visits as directed by your health care provider. This is important. Get help right away if:  You feel lightheaded or you faint.  You have trouble breathing.  You feel pain in your chest,  upper arm, or jaw.  You feel sick to your stomach and your skin is pale, cool, or damp.  You heartbeat feels unusual or abnormal. This information is not intended to replace advice given to you by your health care provider. Make sure you discuss any questions you have with your health care provider. Document Released: 04/20/2004 Document Revised: 12/29/2015 Document Reviewed: 03/01/2014 Elsevier Interactive Patient Education  [employer]2018 [employer]  Your physician has requested that you have an echocardiogram. Echocardiography is a painless test that uses sound waves to create images of your heart. It provides your doctor with information about the size and shape of your heart and how well your heart's chambers and valves are working. This procedure takes approximately one hour. There are no restrictions for this procedure. Echocardiogram An echocardiogram, or echocardiography, uses sound waves (ultrasound) to produce an image of your heart. The echocardiogram is simple, painless, obtained within a short period of time, and offers valuable information to your health care provider. The images from an echocardiogram can provide information such as:  Evidence of coronary artery disease (CAD).  Heart size.  Heart muscle function.  Heart valve function.  Aneurysm detection.  Evidence of a past heart attack.  Fluid buildup around the heart.  Heart muscle thickening.  Assess heart valve function.  Tell a health care provider about:  Any allergies you have.  All medicines you are taking, including vitamins, herbs, eye drops, creams, and over-the-counter medicines.  Any problems you or family members have had with anesthetic medicines.  Any blood  disorders you have.  Any surgeries you have had.  Any medical conditions you have.  Whether you are pregnant or may be pregnant. What happens before the procedure? No special preparation is needed. Eat and drink normally. What happens  during the procedure?  In order to produce an image of your heart, gel will be applied to your chest and a wand-like tool (transducer) will be moved over your chest. The gel will help transmit the sound waves from the transducer. The sound waves will harmlessly bounce off your heart to allow the heart images to be captured in real-time motion. These images will then be recorded.  You may need an IV to receive a medicine that improves the quality of the pictures. What happens after the procedure? You may return to your normal schedule including diet, activities, and medicines, unless your health care provider tells you otherwise. This information is not intended to replace advice given to you by your health care provider. Make sure you discuss any questions you have with your health care provider. Document Released: 07/20/2000 Document Revised: 03/10/2016 Document Reviewed: 03/30/2013 Elsevier Interactive Patient Education  2017 Elsevier Inc.   Follow-Up: Your physician recommends that you schedule a follow-up appointment in: 1 month   Any Other Special Instructions Will Be Listed Below (If Applicable).  Please note that any paperwork needing to be filled out by the provider will need to be addressed at the front desk prior to seeing the provider. Please note that any paperwork FMLA, Disability or other documents regarding health condition is subject to a $25.00 charge that must be received prior to completion of paperwork in the form of a money order or check.    If you need a refill on your cardiac medications before your next appointment, please call your pharmacy.

## 2017-06-13 ENCOUNTER — Encounter: Payer: Self-pay | Admitting: Neurology

## 2017-06-17 ENCOUNTER — Encounter: Payer: Self-pay | Admitting: Neurology

## 2017-06-17 ENCOUNTER — Ambulatory Visit (INDEPENDENT_AMBULATORY_CARE_PROVIDER_SITE_OTHER): Payer: Medicare Other | Admitting: Neurology

## 2017-06-17 VITALS — BP 100/66 | HR 62 | Ht 71.0 in | Wt 220.0 lb

## 2017-06-17 DIAGNOSIS — G4730 Sleep apnea, unspecified: Secondary | ICD-10-CM | POA: Diagnosis not present

## 2017-06-17 DIAGNOSIS — D869 Sarcoidosis, unspecified: Secondary | ICD-10-CM | POA: Diagnosis not present

## 2017-06-17 DIAGNOSIS — I10 Essential (primary) hypertension: Secondary | ICD-10-CM

## 2017-06-17 DIAGNOSIS — G4710 Hypersomnia, unspecified: Secondary | ICD-10-CM

## 2017-06-17 DIAGNOSIS — R9389 Abnormal findings on diagnostic imaging of other specified body structures: Secondary | ICD-10-CM | POA: Diagnosis not present

## 2017-06-17 DIAGNOSIS — R0683 Snoring: Secondary | ICD-10-CM

## 2017-06-17 NOTE — Patient Instructions (Signed)
Please remember to try to maintain good sleep hygiene, which means: Keep a regular sleep and wake schedule, try not to exercise or have a meal within 2 hours of your bedtime, try to keep your bedroom conducive for sleep, that is, cool and dark, without light distractors such as an illuminated alarm clock, and refrain from watching TV right before sleep or in the middle of the night and do not keep the TV or radio on during the night. Also, try not to use or play on electronic devices at bedtime, such as your cell phone, tablet PC or laptop. If you like to read at bedtime on an electronic device, try to dim the background light as much as possible. Do not eat in the middle of the night.   We will request a sleep study.  We will look for leg twitching and snoring or sleep apnea.  We will call you with the sleep study results and make a follow up appointment if needed.     Sleep Apnea Sleep apnea is a condition that affects breathing. People with sleep apnea have moments during sleep when their breathing pauses briefly or gets shallow. Sleep apnea can cause these symptoms:  Trouble staying asleep.  Sleepiness or tiredness during the day.  Irritability.  Loud snoring.  Morning headaches.  Trouble concentrating.  Forgetting things.  Less interest in sex.  Being sleepy for no reason.  Mood swings.  Personality changes.  Depression.  Waking up a lot during the night to pee (urinate).  Dry mouth.  Sore throat.  Follow these instructions at home:  Make any changes in your routine that your doctor recommends.  Eat a healthy, well-balanced diet.  Take over-the-counter and prescription medicines only as told by your doctor.  Avoid using alcohol, calming medicines (sedatives), and narcotic medicines.  Take steps to lose weight if you are overweight.  If you were given a machine (device) to use while you sleep, use it only as told by your doctor.  Do not use any tobacco  products, such as cigarettes, chewing tobacco, and e-cigarettes. If you need help quitting, ask your doctor.  Keep all follow-up visits as told by your doctor. This is important. Contact a doctor if:  The machine that you were given to use during sleep is uncomfortable or does not seem to be working.  Your symptoms do not get better.  Your symptoms get worse. Get help right away if:  Your chest hurts.  You have trouble breathing in enough air (shortness of breath).  You have an uncomfortable feeling in your back, arms, or stomach.  You have trouble talking.  One side of your body feels weak.  A part of your face is hanging down (drooping). These symptoms may be an emergency. Do not wait to see if the symptoms will go away. Get medical help right away. Call your local emergency services (911 in the [place] Do not drive yourself to the hospital. This information is not intended to replace advice given to you by your health care provider. Make sure you discuss any questions you have with your health care provider. Document Released: 05/01/2008 Document Revised: 03/18/2016 Document Reviewed: 05/02/2015 Elsevier Interactive Patient Education  [employer]2018 [employer]
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
## 2017-06-25 ENCOUNTER — Ambulatory Visit (INDEPENDENT_AMBULATORY_CARE_PROVIDER_SITE_OTHER): Payer: Medicare Other | Admitting: Neurology

## 2017-06-25 DIAGNOSIS — G4730 Sleep apnea, unspecified: Secondary | ICD-10-CM

## 2017-06-25 DIAGNOSIS — G4710 Hypersomnia, unspecified: Secondary | ICD-10-CM | POA: Diagnosis not present

## 2017-06-25 DIAGNOSIS — R9389 Abnormal findings on diagnostic imaging of other specified body structures: Secondary | ICD-10-CM

## 2017-06-25 DIAGNOSIS — R0683 Snoring: Secondary | ICD-10-CM

## 2017-06-25 DIAGNOSIS — I10 Essential (primary) hypertension: Secondary | ICD-10-CM

## 2017-06-25 DIAGNOSIS — D869 Sarcoidosis, unspecified: Secondary | ICD-10-CM

## 2017-07-01 ENCOUNTER — Ambulatory Visit: Payer: Medicare Other

## 2017-07-01 ENCOUNTER — Other Ambulatory Visit: Payer: Self-pay | Admitting: Neurology

## 2017-07-01 DIAGNOSIS — I4510 Unspecified right bundle-branch block: Secondary | ICD-10-CM

## 2017-07-01 DIAGNOSIS — R42 Dizziness and giddiness: Secondary | ICD-10-CM

## 2017-07-01 DIAGNOSIS — R0609 Other forms of dyspnea: Secondary | ICD-10-CM

## 2017-07-01 DIAGNOSIS — G4731 Primary central sleep apnea: Secondary | ICD-10-CM

## 2017-07-01 DIAGNOSIS — D869 Sarcoidosis, unspecified: Secondary | ICD-10-CM

## 2017-07-01 DIAGNOSIS — R918 Other nonspecific abnormal finding of lung field: Secondary | ICD-10-CM

## 2017-07-01 DIAGNOSIS — G4733 Obstructive sleep apnea (adult) (pediatric): Secondary | ICD-10-CM

## 2017-07-01 DIAGNOSIS — G4769 Other sleep related movement disorders: Secondary | ICD-10-CM

## 2017-07-01 NOTE — Procedures (Signed)
PATIENT'S NAME:  Gabriel RungWilson, Ho E. DOB:      1950-09-12      MRN#:                            [id number]     DATE OF RECORDING: 06/25/2017 REFERRING M.D.:  Jarome Matinaniel Paterson, M.D. Study Performed:   Baseline Polysomnogram HISTORY:   This patient used CPAP from 2011 through 2016. His last study was at [facility] 11-10-2009 and revealed an AHI of 60.7/hr. Higher pressure on CPAP caused central apnea to emerge and the patient was placed on CPAP 8 with 3 cm EPR, AHI of 4.   Nasal septoplasty, TURP, CKD grade 3, Hyperlipidemia, Sarcoidosis pulmonaris , hypertension, with hypersomnia, and snoring The patient endorsed the Epworth Sleepiness Scale at 8/24 points. The patient's weight 220 pounds with a height of 71 (inches), resulting in a BMI of 30.9 kg/m2.The patient's neck circumference measured 17 inches.  CURRENT MEDICATIONS: Norvasc, Aspirin, Benicar, Ditropan, Pravachol, Pristiq   PROCEDURE:  This is a multichannel digital polysomnogram utilizing the Somnostar 11.2 system.  Electrodes and sensors were applied and monitored per AASM Specifications.   EEG, EOG, Chin and Limb EMG, were sampled at 200 Hz.  ECG, Snore and Nasal Pressure, Thermal Airflow, Respiratory Effort, CPAP Flow and Pressure, Oximetry was sampled at 50 Hz. Digital video and audio were recorded.      BASELINE STUDY : Lights Out was at 21:13 and Lights On at 04:42.  Total recording time (TRT) was 449.5 minutes, with a total sleep time (TST) of 159 minutes.  The patient's sleep latency was 47.5 minutes.  REM latency was 169 minutes.  The sleep efficiency was 35.4 %.     SLEEP ARCHITECTURE: WASO (Wake after sleep onset) was 249.5 minutes.  There were 107 minutes in Stage N1, 29 minutes Stage N2, 0 minutes Stage N3 and 23 minutes in Stage REM.  The percentage of Stage N1 was 67.3%, Stage N2 was 18.2%, Stage N3 was 0% and Stage R (REM sleep) was 14.5%.   RESPIRATORY ANALYSIS:  There were a total of 46 respiratory events:  0 obstructive  apneas, 18 central apneas and 2 mixed apneas with a total of 20 apneas and an apnea index (AI) of 7.5 /hour. There were 26 hypopneas with a hypopnea index of 9.8 /hour. The patient also had 4 respiratory event related arousals (RERAs).The total APNEA/HYPOPNEA INDEX (AHI) was 17.4/hour and the total RESPIRATORY DISTURBANCE INDEX was 18.9 /hour.  6 events occurred in REM sleep and 66 events in NREM. The REM AHI was 15.7 /hour, versus a non-REM AHI of 17.6. The patient spent 6 minutes of total sleep time in the supine position and 153 minutes in non-supine. The supine AHI was 70.0 versus a non-supine AHI of 15.3.  OXYGEN SATURATION & C02:  The Wake baseline 02 saturation was 96%, with the lowest being 87%. Time spent below 89% saturation equaled 5 minutes.   PERIODIC LIMB MOVEMENTS:  The patient had a total of 404 Periodic Limb Movements.  The Periodic Limb Movement (PLM) index was 152.5 and the PLM Arousal index was 14.0/hour. The arousals were noted as: 52 were spontaneous, 37 were associated with PLMs, and 34 were associated with respiratory events.  Audio and video analysis did not show any abnormal or unusual movements, behaviors, phonations or vocalizations.  Snoring was noted. No nocturia seen -EKG was in keeping with normal sinus rhythm (NSR).   Post-study, the patient  indicated that sleep was the same as usual.    IMPRESSION:  1. 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  2. Severe Periodic Limb Movement Disorder (PLMD) 3. Mild Snoring  RECOMMENDATIONS:  1. Mild CSA still present, and since patient is used to CPAP should be treated with CPAP or BiPAP. Order full night titration as treatment emergent central apneas can put patient at risk during auto-titration.  2. There were frequent periodic limb movements of sleep (PLMS) with associated sleep disruption. Research for RLS, anemia, and spinal stenosis, or neuropathy.  Consider treating the PLMS primarily. Consider iron therapy and  evaluation for iron deficiency anemia if the serum ferritin level < 50 ng/ml.  Certain medications or substances may aggravate RLS and common offenders may include the following:  nicotine, caffeine, SSRIs, TCAs, phenothiazine, dopamine antagonists, diphenhydramine, and alcohol.   3. Avoid caffeine-containing beverages and chocolate.  4. Avoid sedative-hypnotics which may worsen sleep apnea, alcohol and tobacco (as applicable). 5. Advise to lose weight, diet and exercise if not contraindicated (BMI 31). 6. A follow up appointment will [facility]. The referring provider will be notified of the results.     I certify that I have reviewed the entire raw data recording prior to the issuance of this report in accordance with the Standards of Accreditation of the American Academy of Sleep Medicine (AASM)     Melvyn Novasarmen Stefanie Hodgens, MD                    07-01-2017  Diplomat, American Board of Psychiatry and Neurology  Diplomat, American Board of Sleep Medicine Medical Director, [facility]

## 2017-07-02 ENCOUNTER — Telehealth: Payer: Self-pay | Admitting: Neurology

## 2017-07-02 NOTE — Telephone Encounter (Signed)
Called the patient to go over sleep results. Spoke with the wife. I advised pt that Dr. Vickey Hugerohmeier reviewed their sleep study results and found that pt has central sleep apnea. Dr. Vickey Hugerohmeier recommends that pt come in for a CPAP titration to assess if treatment with CPAP. Pt had no questions at this time but was encouraged to call back if questions arise.

## 2017-07-02 NOTE — Telephone Encounter (Signed)
-----   Message from Melvyn Novasarmen Dohmeier, MD sent at 07/01/2017  4:55 PM EST ----- Central sleep apnea identified , but main sleep disorder was PLM related. He also had manu spontaneous arousals, which can be related to pain, discomfort, neuropathy etc...  Due to central nature of his sleep apnea an attended sleep study is needed for PAP therapy. CD

## 2017-07-03 ENCOUNTER — Ambulatory Visit (HOSPITAL_BASED_OUTPATIENT_CLINIC_OR_DEPARTMENT_OTHER): Payer: Medicare Other

## 2017-07-08 ENCOUNTER — Ambulatory Visit: Payer: Medicare Other | Admitting: Cardiology

## 2017-07-09 ENCOUNTER — Ambulatory Visit (HOSPITAL_COMMUNITY)
Admission: RE | Admit: 2017-07-09 | Discharge: 2017-07-09 | Disposition: A | Discharge status: Home / Self Care | Payer: Medicare Other | Source: Ambulatory Visit | Attending: Cardiology | Admitting: Cardiology

## 2017-07-09 DIAGNOSIS — D869 Sarcoidosis, unspecified: Secondary | ICD-10-CM | POA: Diagnosis not present

## 2017-07-09 DIAGNOSIS — R0600 Dyspnea, unspecified: Secondary | ICD-10-CM | POA: Insufficient documentation

## 2017-07-09 DIAGNOSIS — I4510 Unspecified right bundle-branch block: Secondary | ICD-10-CM | POA: Diagnosis not present

## 2017-07-09 DIAGNOSIS — R0609 Other forms of dyspnea: Secondary | ICD-10-CM

## 2017-07-09 DIAGNOSIS — R42 Dizziness and giddiness: Secondary | ICD-10-CM | POA: Diagnosis not present

## 2017-07-09 DIAGNOSIS — E785 Hyperlipidemia, unspecified: Secondary | ICD-10-CM | POA: Diagnosis not present

## 2017-07-09 DIAGNOSIS — G4733 Obstructive sleep apnea (adult) (pediatric): Secondary | ICD-10-CM | POA: Insufficient documentation

## 2017-07-09 NOTE — Progress Notes (Signed)
  Echocardiogram 2D Echocardiogram has been performed.  Alfred Stephenson 07/09/2017, 8:37 AM

## 2017-07-10 ENCOUNTER — Ambulatory Visit: Payer: Medicare Other | Admitting: Cardiology

## 2017-07-19 ENCOUNTER — Other Ambulatory Visit (HOSPITAL_BASED_OUTPATIENT_CLINIC_OR_DEPARTMENT_OTHER): Payer: Medicare Other

## 2017-07-22 ENCOUNTER — Encounter: Payer: Self-pay | Admitting: Cardiology

## 2017-07-22 ENCOUNTER — Ambulatory Visit (INDEPENDENT_AMBULATORY_CARE_PROVIDER_SITE_OTHER): Payer: Medicare Other | Admitting: Cardiology

## 2017-07-22 VITALS — BP 132/68 | HR 69 | Ht 71.0 in | Wt 220.0 lb

## 2017-07-22 DIAGNOSIS — E785 Hyperlipidemia, unspecified: Secondary | ICD-10-CM

## 2017-07-22 DIAGNOSIS — D869 Sarcoidosis, unspecified: Secondary | ICD-10-CM | POA: Diagnosis not present

## 2017-07-22 DIAGNOSIS — G4733 Obstructive sleep apnea (adult) (pediatric): Secondary | ICD-10-CM | POA: Diagnosis not present

## 2017-07-22 DIAGNOSIS — I4510 Unspecified right bundle-branch block: Secondary | ICD-10-CM

## 2017-07-22 NOTE — Patient Instructions (Signed)
Medication Instructions:  Your physician recommends that you continue on your current medications as directed. Please refer to the Current Medication list given to you today.  Labwork: None ordered  Testing/Procedures: Your physician has requested that you have a stress echocardiogram. For further information please visit www.[email or web address]. Please follow instruction sheet as given.  Follow-Up: Your physician recommends that you schedule a follow-up appointment in: 1 month with Dr. Krasowski   Any Other Special Instructions Will Be Listed Below (If Applicable).     If you need a refill on your cardiac medications before your next appointment, please call your pharmacy.   

## 2017-07-22 NOTE — Progress Notes (Signed)
Cardiology Office Note:    Date:  07/22/2017   ID:  Alfred RungGeorge E Oettinger, DOB [birth year], MRN [id number]  PCP:  Jarome MatinPaterson, Daniel, MD  Cardiologist:  Gypsy Balsamobert Johngabriel Verde, MD    Referring MD: Jarome MatinPaterson, Daniel, MD   Chief Complaint  Patient presents with  . 1 month follow up  I am doing well  History of Present Illness:    Alfred Stephenson is a 66 y.o. male who presented to us with shortness of breath.  So far cardiac workup is negative.  Still complaining of the fact that when does a lot he will get short of breath no chest pain tightness squeezing pressure burning chest.  He does have history of sarcoidosis apparently stable.  He did wear Holter monitor which did not show any significant findings except for some PVCs and APCs, no sustained arrhythmia, no bradycardia no block.  He also had echocardiogram which showed normal left ventricular ejection fraction overall was normal pulmonary pressure was normal as well.  Past Medical History:  Diagnosis Date  . Anxiety   . Arthritis    arthritis left index finger   . Chronic kidney disease   . Hyperlipidemia   . Hypertension   . Sarcoidosis   . Sleep apnea    cpap does not know settings     Past Surgical History:  Procedure Laterality Date  . BACK SURGERY  2007  . CARDIAC CATHETERIZATION    . GREEN LIGHT LASER TURP (TRANSURETHRAL RESECTION OF PROSTATE  08/22/2012   Procedure: GREEN LIGHT LASER TURP (TRANSURETHRAL RESECTION OF PROSTATE;  Surgeon: Antony HasteMatthew Ramsey Eskridge, MD;  Location: [facility];  Service: Urology;  Laterality: N/A;      . LUNG BIOPSY  1970  . NASAL SINUS SURGERY  2006  . NECK SURGERY  2007    Current Medications: Current Meds  Medication Sig  . amLODipine (NORVASC) 10 MG tablet Take 5 mg by mouth daily.  [name]. aspirin EC 81 MG tablet Take 81 mg by mouth daily.  [name]. olmesartan (BENICAR) 40 MG tablet Take 40 mg by mouth daily.  [name]. oxybutynin (DITROPAN XL) 15 MG 24 hr tablet Take 15 mg by mouth at bedtime.  . pravastatin  (PRAVACHOL) 40 MG tablet Take 40 mg by mouth daily.  [name]. PRISTIQ 100 MG 24 hr tablet Take 100 mg by mouth every morning.      Allergies:   Patient has no known allergies.   Social History   Socioeconomic History  . Marital status: Married    Spouse name: None  . Number of children: None  . Years of education: None  . Highest education level: None  Social Needs  . Financial resource strain: None  . Food insecurity - worry: None  . Food insecurity - inability: None  . Transportation needs - medical: None  . Transportation needs - non-medical: None  Occupational History  . 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 Smoking status: Never Smoker  . Smokeless tobacco: Never Used  Substance and Sexual Activity  . Alcohol use: No  . Drug use: No  . Sexual activity: None  Other Topics Concern  . None  Social History Narrative  . None     Family History: The patient's family history includes CAD in his father; Heart disease in his father; Stroke in his father. ROS:   Please see the history of present illness.    All 14 point review of systems negative except as described per history of present illness  EKGs/Labs/Other Studies Reviewed:      Recent Labs: No results found for requested labs within last 8760 hours.  Recent Lipid Panel No results found for: CHOL, TRIG, HDL, CHOLHDL, VLDL, LDLCALC, LDLDIRECT  Physical Exam:    VS:  BP 132/68   Pulse 69   Ht 5\' 11"  (1.803 m)   Wt 220 lb (99.8 kg)   SpO2 98%   BMI 30.68 kg/m     Wt Readings from Last 3 Encounters:  07/22/17 220 lb (99.8 kg)  06/17/17 220 lb (99.8 kg)  06/10/17 215 lb (97.5 kg)     GEN:  Well nourished, well developed in no acute distress HEENT: Normal NECK: No JVD; No carotid bruits LYMPHATICS: No lymphadenopathy CARDIAC: RRR, no murmurs, no rubs, no gallops RESPIRATORY:  Clear to auscultation without rales, wheezing or rhonchi  ABDOMEN: Soft, non-tender, non-distended MUSCULOSKELETAL:  No  edema; No deformity  SKIN: Warm and dry LOWER EXTREMITIES: no swelling NEUROLOGIC:  Alert and oriented x 3 PSYCHIATRIC:  Normal affect   ASSESSMENT:    1. Right bundle branch block   2. Obstructive sleep apnea   3. Dyslipidemia   4. Sarcoid    PLAN:    In order of problems listed above:  1. Bundle branch block: It is a chronic problem so far no significant finding in heart identified other than bundle branch block.  He does have dyspnea on exertion therefore we will schedule him to have a stress echocardiogram to make sure there is no inducible ischemia.  There is ago he had a stress test which apparently was abnormal he ended up having cardiac catheterization which did not show obstructive lesions. 2. Obstructive sleep apnea: Managed by pulmonary specialist.  He required some additional testing to clarify the diagnosis and treatment 3. Dyslipidemia: We will continue with pravastatin. 4. Sarcoid: If cardiac workup will be negative he probably deserves to have appointment with pulmonary to stage this disease.   Medication Adjustments/Labs and Tests Ordered: Current medicines are reviewed at length with the patient today.  Concerns regarding medicines are outlined above.  No orders of the defined types were placed in this encounter.  Medication changes: No orders of the defined types were placed in this encounter.   Signed, Georgeanna Leaobert J. Ziare Cryder, MD, [facility] 07/22/2017 9:32 AM    [facility]

## 2017-07-25 ENCOUNTER — Ambulatory Visit: Payer: Medicare Other | Admitting: Cardiology

## 2017-08-01 ENCOUNTER — Ambulatory Visit (INDEPENDENT_AMBULATORY_CARE_PROVIDER_SITE_OTHER): Payer: Medicare Other | Admitting: Neurology

## 2017-08-01 DIAGNOSIS — R918 Other nonspecific abnormal finding of lung field: Secondary | ICD-10-CM

## 2017-08-01 DIAGNOSIS — R0609 Other forms of dyspnea: Secondary | ICD-10-CM

## 2017-08-01 DIAGNOSIS — G4731 Primary central sleep apnea: Secondary | ICD-10-CM | POA: Diagnosis not present

## 2017-08-01 DIAGNOSIS — G4769 Other sleep related movement disorders: Secondary | ICD-10-CM

## 2017-08-01 DIAGNOSIS — I4510 Unspecified right bundle-branch block: Secondary | ICD-10-CM

## 2017-08-02 ENCOUNTER — Other Ambulatory Visit: Payer: Self-pay | Admitting: Neurology

## 2017-08-02 DIAGNOSIS — G4731 Primary central sleep apnea: Secondary | ICD-10-CM

## 2017-08-02 DIAGNOSIS — D869 Sarcoidosis, unspecified: Secondary | ICD-10-CM

## 2017-08-02 DIAGNOSIS — R918 Other nonspecific abnormal finding of lung field: Secondary | ICD-10-CM

## 2017-08-02 DIAGNOSIS — R0609 Other forms of dyspnea: Secondary | ICD-10-CM

## 2017-08-02 DIAGNOSIS — I4510 Unspecified right bundle-branch block: Secondary | ICD-10-CM

## 2017-08-02 NOTE — Procedures (Signed)
PATIENT'S NAME:  Alfred Stephenson, Rodgers DOB:      04-14-1951      MR#:    [id number]     DATE OF RECORDING: 08/01/2017 REFERRING M.D.:  Jarome Matinaniel Paterson MD Study Performed:   Titration to positive airway pressure, CPAP and BiPAP.  HISTORY:  This 66 year old male patient is a former CPAP user who returned for CPAP titration following his PSG from11/20/2018, which resulted in a general AHI of 17.4/ hr., supine AHI of 70 and REM AHI of 15.7/hr. He had central dominant, complex sleep apnea.  No significant hypoxemia, but frequent PLMs with arousals at14/hr. Nadir SpO2 of 87%.  The patient endorsed the Epworth Sleepiness Scale at 8 points.  The patient's weight 220 pounds with a height of 71 (inches), resulting in a BMI of 30.9 kg/m2.The patient's neck circumference measured 17 inches.  CURRENT MEDICATIONS: Norvasc, Aspirin, Benicar, Ditropan, Pravachol, Pristiq  PROCEDURE:  This is a multichannel digital polysomnogram utilizing the SomnoStar 11.2 system.  Electrodes and sensors were applied and monitored per AASM Specifications.   EEG, EOG, Chin and Limb EMG, were sampled at 200 Hz.  ECG, Snore and Nasal Pressure, Thermal Airflow, Respiratory Effort, CPAP Flow and Pressure, Oximetry was sampled at 50 Hz. Digital video and audio were recorded.      CPAP was initiated at 5 cmH20 with heated humidity per AASM split night standards and pressure was advanced to 6 cmH20 because of hypopneas, apneas and desaturations. Central apneas emerged. The patient went on to BiPAP at 9/5 cm, and progressed to BiPAP ST from there- at a  pressure of 10/5 cmH20 ST 14, there was a notable reduction of the AHI to 4.1.   ASV was initiated 15/5 with 3 cm EPAP and let again to higher AHI of 20, ASV at 16/5 with 3 cm EPAP resulted in AHI of 40![name]. The patient continued to have central apneas in supine sleep position. The patient will do well on simple BiPAP 9/5 if he sleeps non-supine.       Lights Out was at 21:10 and Lights On at 04:51.  Total recording time (TRT) was 461 minutes, with a total sleep time (TST) of 278.5 minutes. The patient's sleep latency was 54 minutes with 11 minutes of wake time after sleep onset. REM latency was 0 minutes.  The sleep efficiency was 60.4 %.    SLEEP ARCHITECTURE: WASO (Wake after sleep onset) was 141 minutes.  There were 116.5 minutes in Stage N1, 101.5 minutes Stage N2, 60.5 minutes Stage N3 and 0 minutes in Stage REM.  The percentage of Stage N1 was 41.8%, Stage N2 was 36.4%, Stage N3 was 21.7% and Stage R (REM sleep) was 0%.   RESPIRATORY ANALYSIS:  There were 28 respiratory events: 0 obstructive apneas, 26 central apneas and 2 mixed apneas with 0 hypopneas. The patient also had 0 respiratory event related arousals (RERAs).     The total APNEA/HYPOPNEA INDEX (AHI) was 6.0 /hour and the total RESPIRATORY DISTURBANCE INDEX was 6.0/hour   0 events occurred in REM sleep and 28 events in NREM. The REM AHI was 0.0 /hour versus a non-REM AHI of 6.0 /hour.  The patient spent 22 minutes of total sleep time in the supine position and 257 minutes in non-supine. The supine AHI was 21.8, versus a non-supine AHI of 4.7.  OXYGEN SATURATION & C02:  The baseline 02 saturation was 98%, with the lowest being 73%. Time spent below 89% saturation equaled 19 minutes.  PERIODIC LIMB MOVEMENTS:  The patient had a total of 272 Periodic Limb Movements. The Periodic Limb Movement (PLM) index was 58.6 and the PLM Arousal index was 4.3 /hour. The arousals were noted as: 99 were spontaneous, 20 were associated with PLMs, and 13 were associated with respiratory events.  Audio and video analysis did not show any abnormal or unusual movements, behaviors, phonations or vocalizations. The patient took one bathroom break. EKG was in keeping with normal sinus rhythm (NSR).  The patient was fitted with a Respironics (Philips) dream wear mask in medium size. Post-study, the patient indicated that sleep was of good quality.     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  With supine sleep higher pressure on BiPAP ST 10/5 with 14 bpm was used and reduced the centrals to an AHI of 4.1.  2) Severe PLM disorder- causing arousals. This will require medication treatment.   PLANS/RECOMMENDATIONS: BiPAP 9/5 cm water with a Respironics (Philips) dream wear mask in medium size. The machine should allow to set ST if need arises.   a. CPAP clinic follow up in 2-3 months after receiving device; and thereafter, yearly CPAP clinic follow-up is advisable. b. Avoidance of medications with muscle relaxant properties and alcohol if applicable. c. Avoiding sleeping in the supine position (on one's back). 2. Positional therapy:This study indicates that patient has significantly more apneas in the supine position.  It is recommended that the patient use a device which encourages sleep in the non-supine position (e.g., tennis ball-in-back method, using fanny pack or similar device to position elastic ball in small of back during sleep, in order to encourage sleep in the non-supine position). DISCUSSION: [facility] for both, central apnea control and compliance with BiPAP and for PLMs.   Please call [phone] with any questions.      I certify that I have reviewed the entire raw data recording prior to the issuance of this report in accordance with the Standards of Accreditation of the American Academy of Sleep Medicine (AASM)

## 2017-08-12 ENCOUNTER — Ambulatory Visit (HOSPITAL_BASED_OUTPATIENT_CLINIC_OR_DEPARTMENT_OTHER)
Admission: RE | Admit: 2017-08-12 | Discharge: 2017-08-12 | Disposition: A | Discharge status: Home / Self Care | Payer: Medicare Other | Source: Ambulatory Visit | Attending: Cardiology | Admitting: Cardiology

## 2017-08-12 DIAGNOSIS — E785 Hyperlipidemia, unspecified: Secondary | ICD-10-CM | POA: Diagnosis not present

## 2017-08-12 DIAGNOSIS — Z823 Family history of stroke: Secondary | ICD-10-CM | POA: Insufficient documentation

## 2017-08-12 DIAGNOSIS — Z8249 Family history of ischemic heart disease and other diseases of the circulatory system: Secondary | ICD-10-CM | POA: Insufficient documentation

## 2017-08-12 DIAGNOSIS — G4733 Obstructive sleep apnea (adult) (pediatric): Secondary | ICD-10-CM | POA: Diagnosis not present

## 2017-08-12 DIAGNOSIS — N189 Chronic kidney disease, unspecified: Secondary | ICD-10-CM | POA: Insufficient documentation

## 2017-08-12 DIAGNOSIS — I4510 Unspecified right bundle-branch block: Secondary | ICD-10-CM | POA: Insufficient documentation

## 2017-08-12 DIAGNOSIS — D869 Sarcoidosis, unspecified: Secondary | ICD-10-CM | POA: Insufficient documentation

## 2017-08-12 DIAGNOSIS — R0602 Shortness of breath: Secondary | ICD-10-CM | POA: Insufficient documentation

## 2017-08-12 DIAGNOSIS — I129 Hypertensive chronic kidney disease with stage 1 through stage 4 chronic kidney disease, or unspecified chronic kidney disease: Secondary | ICD-10-CM | POA: Diagnosis not present

## 2017-08-12 DIAGNOSIS — F419 Anxiety disorder, unspecified: Secondary | ICD-10-CM | POA: Diagnosis not present

## 2017-08-12 NOTE — Progress Notes (Signed)
Echocardiogram Echocardiogram Stress Test has been performed.  Dorothey BasemanReel, Jasmin Winberry M 08/12/2017, 12:09 PM

## 2017-08-26 ENCOUNTER — Ambulatory Visit (INDEPENDENT_AMBULATORY_CARE_PROVIDER_SITE_OTHER): Payer: Medicare Other | Admitting: Cardiology

## 2017-08-26 ENCOUNTER — Encounter: Payer: Self-pay | Admitting: Cardiology

## 2017-08-26 VITALS — BP 120/90 | HR 58 | Ht 71.0 in | Wt 218.0 lb

## 2017-08-26 DIAGNOSIS — E785 Hyperlipidemia, unspecified: Secondary | ICD-10-CM

## 2017-08-26 DIAGNOSIS — G4733 Obstructive sleep apnea (adult) (pediatric): Secondary | ICD-10-CM | POA: Diagnosis not present

## 2017-08-26 DIAGNOSIS — D869 Sarcoidosis, unspecified: Secondary | ICD-10-CM | POA: Diagnosis not present

## 2017-08-26 DIAGNOSIS — R0609 Other forms of dyspnea: Secondary | ICD-10-CM

## 2017-08-26 DIAGNOSIS — R918 Other nonspecific abnormal finding of lung field: Secondary | ICD-10-CM

## 2017-08-26 NOTE — Patient Instructions (Signed)
Medication Instructions:  Your physician recommends that you continue on your current medications as directed. Please refer to the Current Medication list given to you today.  Labwork: None ordered  Testing/Procedures: None ordered  Follow-Up: Your physician recommends that you schedule a follow-up appointment in: 3 months with Dr. Bing MatterKrasowski  We have also placed a referral with Pulmonary, they will contact you to set up an appointment.    Any Other Special Instructions Will Be Listed Below (If Applicable).     If you need a refill on your cardiac medications before your next appointment, please call your pharmacy.

## 2017-08-26 NOTE — Addendum Note (Signed)
Addended by: Arville CareHUNT, AMANDA N on: 08/26/2017 09:36 AM   Modules accepted: Orders

## 2017-08-26 NOTE — Progress Notes (Signed)
Cardiology Office Note:    Date:  08/26/2017   ID:  Alfred Stephenson, DOB 1951/03/02, MRN [id number]  PCP:  Jarome Matin, MD  Cardiologist:  Gypsy Balsam, MD    Referring MD: Jarome Matin, MD   Chief Complaint  Patient presents with  . Follow-up  Still having short of breath but slightly better  History of Present Illness:    Alfred Stephenson is a 67 y.o. male with dyspnea on exertion.  We did cardiac workup looking for cardiac reason for shortness of breath and so far workup has been negative.  His echocardiogram showed normal left ventricular ejection fraction, no pulmonary hypertension, stress to show no evidence of ischemia.  He did wear Holter monitor which did not show any significant changes.  I can conclude that I do not see cardiac reasons for him to be short of breath.  I think what now we need to reorient our investigation he need to see pulmonologist he does have multiple lung nodules as well as sarcoid that definitely need to be investigated.  He was fine to have significant sleep apnea and that is another reason why he need to see pulmonologist to talk about that issue.  His pulmonologist was Dr. Maple Hudson however he retired therefore we need to refer him to our pulmonary clinic.  Past Medical History:  Diagnosis Date  . Anxiety   . Arthritis    arthritis left index finger   . Chronic kidney disease   . Hyperlipidemia   . Hypertension   . Sarcoidosis   . Sleep apnea    cpap does not know settings     Past Surgical History:  Procedure Laterality Date  . BACK SURGERY  2007  . CARDIAC CATHETERIZATION    . GREEN LIGHT LASER TURP (TRANSURETHRAL RESECTION OF PROSTATE  08/22/2012   Procedure: GREEN LIGHT LASER TURP (TRANSURETHRAL RESECTION OF PROSTATE;  Surgeon: Antony Haste, MD;  Location: [facility];  Service: Urology;  Laterality: N/A;      . LUNG BIOPSY  1970  . NASAL SINUS SURGERY  2006  . NECK SURGERY  2007    Current Medications: Current Meds    Medication Sig  . amLODipine (NORVASC) 10 MG tablet Take 5 mg by mouth daily.  [name] aspirin EC 81 MG tablet Take 81 mg by mouth daily.  [name] olmesartan (BENICAR) 40 MG tablet Take 40 mg by mouth daily.  [name] oxybutynin (DITROPAN XL) 15 MG 24 hr tablet Take 15 mg by mouth at bedtime.  . pravastatin (PRAVACHOL) 40 MG tablet Take 40 mg by mouth daily.  [name] PRISTIQ 100 MG 24 hr tablet Take 100 mg by mouth every morning.      Allergies:   Patient has no known allergies.   Social History   Socioeconomic History  . Marital status: Married    Spouse name: None  . Number of children: None  . Years of education: None  . Highest education level: None  Social Needs  . Financial resource strain: None  . Food insecurity - worry: None  . Food insecurity - inability: None  . Transportation needs - medical: None  . Transportation needs - non-medical: None  Occupational History  . 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 Smoking status: Never Smoker  . Smokeless tobacco: Never Used  Substance and Sexual Activity  . Alcohol use: No  . Drug use: No  . Sexual activity: None  Other Topics Concern  . None  Social History Narrative  .  None     Family History: The patient's family history includes CAD in his father; Heart disease in his father; Stroke in his father. ROS:   Please see the history of present illness.    All 14 point review of systems negative except as described per history of present illness  EKGs/Labs/Other Studies Reviewed:      Recent Labs: No results found for requested labs within last 8760 hours.  Recent Lipid Panel No results found for: CHOL, TRIG, HDL, CHOLHDL, VLDL, LDLCALC, LDLDIRECT  Physical Exam:    VS:  BP 120/90 (BP Location: Right Arm, Patient Position: Sitting, Cuff Size: Normal)   Pulse (!) 58   Ht 5\' 11"  (1.803 m)   Wt 218 lb (98.9 kg)   SpO2 98%   BMI 30.40 kg/m     Wt Readings from Last 3 Encounters:  08/26/17 218 lb (98.9 kg)  07/22/17  220 lb (99.8 kg)  06/17/17 220 lb (99.8 kg)     GEN:  Well nourished, well developed in no acute distress HEENT: Normal NECK: No JVD; No carotid bruits LYMPHATICS: No lymphadenopathy CARDIAC: RRR, no murmurs, no rubs, no gallops RESPIRATORY:  Clear to auscultation without rales, wheezing or rhonchi  ABDOMEN: Soft, non-tender, non-distended MUSCULOSKELETAL:  No edema; No deformity  SKIN: Warm and dry LOWER EXTREMITIES: no swelling NEUROLOGIC:  Alert and oriented x 3 PSYCHIATRIC:  Normal affect   ASSESSMENT:    1. Obstructive sleep apnea   2. Dyslipidemia   3. Dyspnea on exertion   4. Multiple lung nodules   5. Sarcoid    PLAN:    In order of problems listed above:  1. Dyspnea on exertion: I do not see cardiac reasons for it.  I will referred to pulmonary. 2. Dyslipidemia: He is on statin which I will continue for now. 3. Pulmonary nodules and sarcoid: He will be referred to pulmonologist. 4. Obstructive sleep apnea: Recent diagnosis follow-up with pulmonary.   Medication Adjustments/Labs and Tests Ordered: Current medicines are reviewed at length with the patient today.  Concerns regarding medicines are outlined above.  No orders of the defined types were placed in this encounter.  Medication changes: No orders of the defined types were placed in this encounter.   Signed, Georgeanna Leaobert J. Krasowski, MD, [facility] 08/26/2017 9:29 AM    [facility]

## 2017-09-30 ENCOUNTER — Institutional Professional Consult (permissible substitution): Payer: Medicare Other | Admitting: Pulmonary Disease

## 2017-11-06 ENCOUNTER — Other Ambulatory Visit: Payer: Self-pay | Admitting: Neurology

## 2017-11-06 ENCOUNTER — Telehealth: Payer: Self-pay | Admitting: Neurology

## 2017-11-06 NOTE — Telephone Encounter (Signed)
Called the patient. Pt spoke with Sheena earlier and states that he never heard results from his bipap titration. I have called the patient to go over those results with him. [facility] us to send orders to get him set up with Bipap. I will go ahead and send these orders over to [facility].  LVM for the pt to call us back so that I can review with him.

## 2017-11-07 NOTE — Telephone Encounter (Signed)
Pt called back and I was able to go over sleep results with him from his titration test. I advised pt that Dr. Vickey Hugerohmeier reviewed their sleep study results and found that pt has complex sleep apnea. Dr. Vickey Hugerohmeier recommends that pt starts Bipap. I reviewed PAP compliance expectations with the pt. Pt is agreeable to starting a CPAP. I advised pt that an order will be sent to a DME, [facility], and [facility] will call the pt within about one week after they file with the pt's insurance. [facility] will show the pt how to use the machine, fit for masks, and troubleshoot the CPAP if needed. A follow up appt was made for insurance purposes with Darrol Angelarolyn Martin, NP on February 04, 2018. Pt verbalized understanding to arrive 15 minutes early and bring their CPAP. A letter with all of this information in it will be mailed to the pt as a reminder. I verified with the pt that the address we have on file is correct. Pt verbalized understanding of results. Pt had no questions at this time but was encouraged to call back if questions arise.

## 2017-11-19 ENCOUNTER — Encounter: Payer: Self-pay | Admitting: Pulmonary Disease

## 2017-11-19 ENCOUNTER — Ambulatory Visit (INDEPENDENT_AMBULATORY_CARE_PROVIDER_SITE_OTHER): Payer: Medicare Other | Admitting: Pulmonary Disease

## 2017-11-19 ENCOUNTER — Ambulatory Visit (INDEPENDENT_AMBULATORY_CARE_PROVIDER_SITE_OTHER)
Admission: RE | Admit: 2017-11-19 | Discharge: 2017-11-19 | Disposition: A | Discharge status: Home / Self Care | Payer: Medicare Other | Source: Ambulatory Visit | Attending: Pulmonary Disease | Admitting: Pulmonary Disease

## 2017-11-19 VITALS — BP 112/70 | HR 75 | Ht 71.0 in | Wt 219.0 lb

## 2017-11-19 DIAGNOSIS — D860 Sarcoidosis of lung: Secondary | ICD-10-CM

## 2017-11-19 NOTE — Patient Instructions (Signed)
Chest xray today Will schedule pulmonary function test Follow up in 3 weeks with Dr. Cavan Bearden or Nurse Practitioner 

## 2017-11-19 NOTE — Progress Notes (Signed)
[facility]  Chief Complaint  Patient presents with  . sleep consult    Pt referred by Dr. Jarold Motto MD and cardiologist in [place]. Pt snores at night, wakes up tired.     Vital signs: BP 112/70 (BP Location: Left Arm, Cuff Size: Normal)   Pulse 75   Ht 5\' 11"  (1.803 m)   Wt 219 lb (99.3 kg)   SpO2 98%   BMI 30.54 kg/m   History of Present Illness: Alfred Stephenson is a 67 y.o. male with dyspnea and history of sarcoidosis.  He was previously followed by Dr. Maple Hudson.  He had mediastinoscopy in 1970's and found to have sarcoidosis.  It is not clear if he was ever treated for this.    He gets short of breath with activity.  He feels like he can't expand his lungs.  He is not having cough, wheeze, fever, skin rash, chest pain, swelling, or palpitations.  He denies difficulty with his vision or swallowing.  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  He never smoked cigarettes.  No animal/bird exposures.  No recent travel.  He had pneumonia years ago.  No history of exposure to TB.  Physical Exam:  General - pleasant Eyes - pupils reactive ENT - no sinus tenderness, no oral exudate, no LAN Cardiac - regular, no murmur Chest - no wheeze, rales Abd - soft, non tender Ext - no edema Skin - no rashes Neuro - normal strength Psych - normal mood  Discussion: He has persistent symptoms of dyspnea on exertion.  He has history of sarcoidosis.  He also has diastolic dysfunction on recent echocardiogram.  Assessment/Plan:  Dyspnea on exertion. - get chest xray and pulmonary function test - based on results will determine if additional lab testing or CT chest imaging is needed  Obstructive sleep apnea. - he is followed by Dr. Vickey Huger with [facility] for this   Patient Instructions  Chest xray today  Will schedule pulmonary function test  Follow up in 3 weeks with Dr. Craige Cotta or Nurse Practitioner   [name], MD [facility]  Pulmonary/Critical Care 11/19/2017, 12:45 PM  Flow Sheet  Pulmonary tests: ACE 04/17/11 >> 53 PFT 06/19/11 >> FEV1 3.98 (121%), FEV1% 80, TLC 7.73 (113%), DLCO 120%  Chest imaging: CT chest 03/13/11 >> multiple non-calcified nodules primarily in upper lungs with patchy GGO CT chest 07/06/11 >> no change  Sleep tests: PSG 06/25/17 >> AHI 17.6 Bipap titration 08/01/17 >> Bipap 9/5 cm H2O  Cardiac tests: Echo 07/09/17 >> EF 55 to 60%, grade 2 DD, PAS 23 mmHg  Review of Systems: Constitutional: Negative for fever and unexpected weight change.  HENT: Positive for congestion and sinus pressure. Negative for dental problem, ear pain, nosebleeds, postnasal drip, rhinorrhea, sneezing, sore throat and trouble swallowing.   Eyes: Negative for redness and itching.  Respiratory: Positive for shortness of breath. Negative for cough, chest tightness and wheezing.   Cardiovascular: Negative for palpitations and leg swelling.  Gastrointestinal: Negative for nausea and vomiting.  Genitourinary: Negative for dysuria.  Musculoskeletal: Negative for joint swelling.  Skin: Negative for rash.  Allergic/Immunologic: Positive for environmental allergies. Negative for food allergies and immunocompromised state.  Neurological: Negative for headaches.  Hematological: Does not bruise/bleed easily.  Psychiatric/Behavioral: Negative for dysphoric mood. The patient is not nervous/anxious.    Past Medical History: He  has a past medical history of Anxiety, Arthritis, Chronic kidney disease, Hyperlipidemia, Hypertension, Sarcoidosis, and Sleep apnea.  Past  Surgical History: He  has a past surgical history that includes Lung biopsy (1970); Neck surgery (2007); Back surgery (2007); Nasal sinus surgery (2006); Green light laser turp (transurethral resection of prostate (08/22/2012); and Cardiac catheterization.  Family History: His family history includes CAD in his father; Heart disease in his father; Stroke in his  father.  Social History: He  reports that he has never smoked. He has never used smokeless tobacco. He reports that he does not drink alcohol or use drugs.  Medications: Allergies as of 11/19/2017   No Known Allergies     Medication List        Accurate as of 11/19/17 12:45 PM. Always use your most recent med list.          amLODipine 10 MG tablet Commonly known as:  NORVASC Take 5 mg by mouth daily.   aspirin EC 81 MG tablet Take 81 mg by mouth daily.   oxybutynin 15 MG 24 hr tablet Commonly known as:  DITROPAN XL Take 15 mg by mouth at bedtime.   pravastatin 40 MG tablet Commonly known as:  PRAVACHOL Take 40 mg by mouth daily.   PRISTIQ 100 MG 24 hr tablet Generic drug:  desvenlafaxine Take 100 mg by mouth every morning.

## 2017-11-19 NOTE — Progress Notes (Signed)
   Subjective:    Patient ID: Alfred Stephenson, male    DOB: [birth year], 67 y.o.   MRN: [id number]  HPI    Review of Systems  Constitutional: Negative for fever and unexpected weight change.  HENT: Positive for congestion and sinus pressure. Negative for dental problem, ear pain, nosebleeds, postnasal drip, rhinorrhea, sneezing, sore throat and trouble swallowing.   Eyes: Negative for redness and itching.  Respiratory: Positive for shortness of breath. Negative for cough, chest tightness and wheezing.   Cardiovascular: Negative for palpitations and leg swelling.  Gastrointestinal: Negative for nausea and vomiting.  Genitourinary: Negative for dysuria.  Musculoskeletal: Negative for joint swelling.  Skin: Negative for rash.  Allergic/Immunologic: Positive for environmental allergies. Negative for food allergies and immunocompromised state.  Neurological: Negative for headaches.  Hematological: Does not bruise/bleed easily.  Psychiatric/Behavioral: Negative for dysphoric mood. The patient is not nervous/anxious.        Objective:   Physical Exam        Assessment & Plan:

## 2017-11-24 ENCOUNTER — Telehealth: Payer: Self-pay | Admitting: Pulmonary Disease

## 2017-11-24 DIAGNOSIS — D869 Sarcoidosis, unspecified: Secondary | ICD-10-CM

## 2017-11-24 NOTE — Telephone Encounter (Signed)
CLINICAL DATA:  67 y/o M; routine, no complaints. History of hypertension and sarcoid.  EXAM: CHEST - 2 VIEW  COMPARISON:  08/15/2012 chest radiograph  FINDINGS: Stable cardiomediastinal silhouette. Stable upper lobe predominant reticulonodular opacities. No new consolidation, effusion, or pneumothorax. Anterior cervical fusion hardware is partially visualized. Bones are otherwise unremarkable  IMPRESSION: Stable upper lobe predominant reticulonodular opacities compatible with sarcoidosis. No new acute pulmonary process identified.   Electronically Signed   By: Mitzi HansenLance  Furusawa-Stratton M.D.   On: 11/19/2017 15:02   Please let him know his chest xray shows chronic changes from history of sarcoidosis.  Please schedule him for high resolution CT chest to further assess whether there has been progression since his last CT chest in 2012.

## 2017-11-25 NOTE — Telephone Encounter (Signed)
Attempted to call pt but no answer.   Left message for pt to return our call x1 

## 2017-11-27 NOTE — Telephone Encounter (Signed)
Advised pt of results. Pt understood and nothing further is needed.   CT ordered

## 2017-12-11 ENCOUNTER — Ambulatory Visit (INDEPENDENT_AMBULATORY_CARE_PROVIDER_SITE_OTHER)
Admission: RE | Admit: 2017-12-11 | Discharge: 2017-12-11 | Disposition: A | Discharge status: Home / Self Care | Payer: Medicare Other | Source: Ambulatory Visit | Attending: Pulmonary Disease | Admitting: Pulmonary Disease

## 2017-12-11 ENCOUNTER — Ambulatory Visit: Payer: Medicare Other | Admitting: Acute Care

## 2017-12-11 DIAGNOSIS — D869 Sarcoidosis, unspecified: Secondary | ICD-10-CM | POA: Diagnosis not present

## 2017-12-16 ENCOUNTER — Telehealth: Payer: Self-pay | Admitting: Pulmonary Disease

## 2017-12-16 NOTE — Telephone Encounter (Signed)
HRCT chest 12/11/17 >> atherosclerosis, calcified LAN, patchy nodular thickening b/l interstitium mostly upper lobes, patchy GGO upper lobes, mild traction BTX RUL, air trapping   Please let him know his CT chest shows changes of sarcoidosis.  If he is still having trouble with his breathing, then he might need a trial of prednisone.  This will be discussed in more detail at his next ROV later this month.

## 2017-12-17 NOTE — Telephone Encounter (Signed)
Called and spoke with patient regarding results.  Informed the patient of results and recommendations today. Pt verbalized understanding and denied any questions or concerns at this time.  Nothing further needed.  

## 2017-12-18 ENCOUNTER — Encounter: Payer: Self-pay | Admitting: Acute Care

## 2017-12-18 ENCOUNTER — Ambulatory Visit (INDEPENDENT_AMBULATORY_CARE_PROVIDER_SITE_OTHER): Payer: Medicare Other | Admitting: Pulmonary Disease

## 2017-12-18 ENCOUNTER — Ambulatory Visit (INDEPENDENT_AMBULATORY_CARE_PROVIDER_SITE_OTHER): Payer: Medicare Other | Admitting: Acute Care

## 2017-12-18 ENCOUNTER — Other Ambulatory Visit: Payer: Medicare Other

## 2017-12-18 VITALS — BP 118/76 | HR 84 | Ht 70.0 in | Wt 223.0 lb

## 2017-12-18 DIAGNOSIS — G4733 Obstructive sleep apnea (adult) (pediatric): Secondary | ICD-10-CM

## 2017-12-18 DIAGNOSIS — D860 Sarcoidosis of lung: Secondary | ICD-10-CM | POA: Diagnosis not present

## 2017-12-18 DIAGNOSIS — D869 Sarcoidosis, unspecified: Secondary | ICD-10-CM

## 2017-12-18 LAB — PULMONARY FUNCTION TEST
DL/VA % pred: 111 %
DL/VA: 5.13 ml/min/mmHg/L
DLCO unc % pred: 95 %
DLCO unc: 30.91 ml/min/mmHg
FEF 25-75 PRE: 3.58 L/s
FEF 25-75 Post: 2.68 L/sec
FEF2575-%CHANGE-POST: -25 %
FEF2575-%Pred-Post: 105 %
FEF2575-%Pred-Pre: 140 %
FEV1-%Change-Post: -7 %
FEV1-%Pred-Post: 97 %
FEV1-%Pred-Pre: 105 %
FEV1-POST: 3.23 L
FEV1-PRE: 3.5 L
FEV1FVC-%CHANGE-POST: 0 %
FEV1FVC-%Pred-Pre: 110 %
FEV6-%CHANGE-POST: -7 %
FEV6-%PRED-PRE: 101 %
FEV6-%Pred-Post: 93 %
FEV6-POST: 3.96 L
FEV6-PRE: 4.29 L
FEV6FVC-%Change-Post: 0 %
FEV6FVC-%PRED-PRE: 105 %
FEV6FVC-%Pred-Post: 105 %
FVC-%CHANGE-POST: -7 %
FVC-%Pred-Post: 88 %
FVC-%Pred-Pre: 96 %
FVC-POST: 3.96 L
FVC-Pre: 4.29 L
POST FEV6/FVC RATIO: 100 %
Post FEV1/FVC ratio: 82 %
Pre FEV1/FVC ratio: 81 %
Pre FEV6/FVC Ratio: 100 %
RV % PRED: 86 %
RV: 2.09 L
TLC % pred: 93 %
TLC: 6.53 L

## 2017-12-18 MED ORDER — PREDNISONE 10 MG PO TABS: 30.0000 mg | ORAL_TABLET | Freq: Every day | ORAL | 0 refills | Status: DC

## 2017-12-18 NOTE — Assessment & Plan Note (Signed)
   Continue on CPAP at bedtime. You appear to be benefiting from the treatment Goal is to wear for at least 6 hours each night for maximal clinical benefit. Continue to work on weight loss, as the link between excess weight  and sleep apnea is well established.  Do not drive if sleepy. Remember to clean mask, tubing, filter, and reservoir once weekly with soapy water.  [facility] as scheduled.

## 2017-12-18 NOTE — Progress Notes (Signed)
History of Present Illness Alfred Stephenson is a 67 y.o. male never smoker with dyspnea and history of sarcoidosis ( Biopsy proven 1970's), and OSA ( Followed by [facility])  He is followed by Dr. Craige Cotta.   12/18/2017  Pt. Presents for follow up after seeing Dr. Craige Cotta 11/19/2017. He had been having persistent symptoms of dyspnea on exertion. Recent echo indicated diastolic dysfunction.  Plan of care after the visit was as noted below:  Dyspnea on exertion. - get chest xray and pulmonary function test - based on results will determine if additional lab testing or CT chest imaging is needed  Obstructive sleep apnea. - he is followed by Dr. Vickey Huger with [facility] for this  Pt. Returns for follow up. He states his shortness of breath is about the same as when he saw Dr. Craige Cotta. No productive cough. Dyspnea when lifting or walking a long distance. He does have a membership to [employer], but says he does not go often enough.  We discussed that the CT scan and the PFT's show some progression of his sarcoid. He is agreeable to a prednisone course with short term follow up to see if he is better, with goal of lowest dose for the shortest period of time.[name] He states he is compliant with his CPAP. He has an appointment with Dr. Vickey Huger next month for management.He denies fever, chest pain, orthopnea or hemoptysis.  Test Results:   CXR 12/18/2017 IMPRESSION: Stable upper lobe predominant reticulonodular opacities compatible with sarcoidosis. No new acute pulmonary process identified.  Pulmonary tests: ACE 12/18/2017>> ACE 04/17/11 >> 53  PFT 12/18/2017>>FEV1 3.50 ( 105%), F/F Ratio 81 or 110%, TLC 6.53 ( 93%) DLCO 95% PFT 06/19/11 >> FEV1 3.98 (121%), FEV1% 80, TLC 7.73 (113%), DLCO 120% >> Normal spirometry  Chest imaging: CT Chest 12/11/2017>>  Upper lung predominant pulmonary parenchymal findings compatible with sarcoidosis including mild upper lobe fibrosis,  mildly progressed since 2012 chest CT. 2. Mild-to-moderate patchy air trapping in both lungs, indicative of small airways disease, also attributable pulmonary sarcoidosis. 3. Mild mediastinal and bilateral hilar adenopathy, stable since 2012, compatible with sarcoidosis. 4. Two-vessel coronary atherosclerosis. 5. Small hiatal hernia.  CT chest 03/13/11 >> multiple non-calcified nodules primarily in upper lungs with patchy GGO CT chest 07/06/11 >> no change  Sleep tests: PSG 06/25/17 >> AHI 17.6 Bipap titration 08/01/17 >> Bipap 9/5 cm H2O  Cardiac tests: Echo 07/09/17 >> EF 55 to 60%, grade 2 DD, PAS 23 mmHg, MV Trivial regurg Echo Stress test:>> There were no stress arrhythmias or   conduction abnormalities. The stress ECG was negative for   ischemia.  Staged echo: There was no echocardiographic evidence for   stress-induced ischemia.    CBC Latest Ref Rng & Units 08/15/2012 11/13/2009 11/13/2009  WBC 4.0 - 10.5 K/uL 5.0 - 9.2  Hemoglobin 13.0 - 17.0 g/dL 16.1 17.3(H) 16.5  Hematocrit 39.0 - 52.0 % 45.5 51.0 48.5  Platelets 150 - 400 K/uL 229 - 208    BMP Latest Ref Rng & Units 08/15/2012 11/13/2009 11/17/2008  Glucose 70 - 99 mg/dL 096(E) 99 454(U)  BUN 6 - 23 mg/dL Creatinine 0.50 - 1.35 mg/dL 9.81 1.5 1.9(J)  Sodium 135 - 145 mEq/L 138 140 137  Potassium 3.5 - 5.1 mEq/L 4.0 4.1 3.3(L)  Chloride 96 - 112 mEq/L 101 106 104  CO2 19 - 32 mEq/L 27 - -  Calcium 8.4 - 10.5 mg/dL 9.8 - -  BNP No results found for: BNP  ProBNP    Component Value Date/Time   PROBNP 44.0 08/31/2008 2113    PFT    Component Value Date/Time   FEV1PRE 3.50 12/18/2017 1154   FEV1POST 3.23 12/18/2017 1154   FVCPRE 4.29 12/18/2017 1154   FVCPOST 3.96 12/18/2017 1154   TLC 6.53 12/18/2017 1154   DLCOUNC 30.91 12/18/2017 1154   PREFEV1FVCRT 81 12/18/2017 1154   PSTFEV1FVCRT 82 12/18/2017 1154    Dg Chest 2 View  Result Date: 11/19/2017 CLINICAL DATA:  67 y/o M; routine,  no complaints. History of hypertension and sarcoid. EXAM: CHEST - 2 VIEW COMPARISON:  08/15/2012 chest radiograph FINDINGS: Stable cardiomediastinal silhouette. Stable upper lobe predominant reticulonodular opacities. No new consolidation, effusion, or pneumothorax. Anterior cervical fusion hardware is partially visualized. Bones are otherwise unremarkable IMPRESSION: Stable upper lobe predominant reticulonodular opacities compatible with sarcoidosis. No new acute pulmonary process identified. Electronically Signed   By: Mitzi Hansen M.D.   On: 11/19/2017 15:02   Ct Chest High Resolution  Result Date: 12/11/2017 CLINICAL DATA:  Sarcoidosis, follow-up. EXAM: CT CHEST WITHOUT CONTRAST TECHNIQUE: Multidetector CT imaging of the chest was performed following the standard protocol without intravenous contrast. High resolution imaging of the lungs, as well as inspiratory and expiratory imaging, was performed. COMPARISON:  11/19/2017 chest radiograph.  07/06/2011 chest CT. FINDINGS: Cardiovascular: Normal heart size. No significant pericardial effusion/thickening. Left anterior descending and left circumflex coronary atherosclerosis. Atherosclerotic nonaneurysmal thoracic aorta. Normal caliber pulmonary arteries. Mediastinum/Nodes: No discrete thyroid nodules. Unremarkable esophagus. No axillary adenopathy. Coarsely calcified AP window node is stable. Mildly enlarged non calcified bilateral paratracheal and subcarinal lymph nodes are stable. Representative 1.0 cm right paratracheal node (series 16/image 25), 1.2 cm left paratracheal node (series 16/image 46) and 1.0 cm subcarinal node (series 16/image 60), all stable since 07/06/2011 chest CT. Mild bilateral hilar adenopathy is poorly delineated on these noncontrast images and appears stable. Lungs/Pleura: No pneumothorax. No pleural effusion. There is patchy prominent nodular thickening of the central and peripheral peribronchovascular interstitium in both  lungs, predominantly involving the upper lobes with relative sparing of the lung bases. There is associated mild architectural distortion and volume loss in the upper lungs. There is additional patchy finely nodular ground-glass opacity throughout the bilateral upper lungs, also involving the central and peripheral peribronchovascular regions. There is scattered fine nodularity along fissures and peripheral pleural surfaces bilaterally. Mild traction bronchiectasis is present in the posterior right upper lobe. There is mild progression of these findings since 07/06/2011 chest CT. For example clustered nodularity in the posterior right upper lung measures 2.6 x 1.5 cm (series 17/image 38), increased from 1.9 x 1.2 cm. Irregular nodularity in left upper lobe measuring 2.1 x 1.2 cm (series 17/image 27), increased from 1.6 x 1.1 cm. Posterior right lower lobe irregular 1.2 cm nodule (series 17/image 85), minimally increased from 1.1 cm. No frank honeycombing. Mild to moderate patchy air trapping in both lungs on the expiration sequence. Upper abdomen: Small hiatal hernia. Musculoskeletal: No aggressive appearing focal osseous lesions. Moderate thoracic spondylosis. Partially visualized surgical hardware from ACDF in the lower cervical spine. IMPRESSION: 1. Upper lung predominant pulmonary parenchymal findings compatible with sarcoidosis including mild upper lobe fibrosis, mildly progressed since 2012 chest CT. 2. Mild-to-moderate patchy air trapping in both lungs, indicative of small airways disease, also attributable pulmonary sarcoidosis. 3. Mild mediastinal and bilateral hilar adenopathy, stable since 2012, compatible with sarcoidosis. 4. Two-vessel coronary atherosclerosis. 5. Small hiatal hernia. Aortic Atherosclerosis (ICD10-I70.0). Electronically Signed  By: Delbert Phenix M.D.   On: 12/11/2017 14:36     Past medical hx Past Medical History:  Diagnosis Date  . Anxiety   . Arthritis    arthritis left index  finger   . Chronic kidney disease   . Hyperlipidemia   . Hypertension   . Sarcoidosis   . Sleep apnea    cpap does not know settings      Social History   Tobacco Use  . Smoking status: Never Smoker  . Smokeless tobacco: Never Used  Substance Use Topics  . Alcohol use: No  . Drug use: No    Mr.Hepner reports that he has never smoked. He has never used smokeless tobacco. He reports that he does not drink alcohol or use drugs.  Tobacco Cessation: Never smoker Past surgical hx, Family hx, Social hx all reviewed.  Current Outpatient Medications on File Prior to Visit  Medication Sig  . amLODipine (NORVASC) 10 MG tablet Take 5 mg by mouth daily.  [name] aspirin EC 81 MG tablet Take 81 mg by mouth daily.  [name] oxybutynin (DITROPAN XL) 15 MG 24 hr tablet Take 15 mg by mouth at bedtime.  . pravastatin (PRAVACHOL) 40 MG tablet Take 40 mg by mouth daily.  [name] PRISTIQ 100 MG 24 hr tablet Take 100 mg by mouth every morning.    No current facility-administered medications on file prior to visit.      No Known Allergies  Review Of Systems:  Constitutional:   No  weight loss, night sweats,  Fevers, chills, fatigue, or  lassitude.  HEENT:   No headaches,  Difficulty swallowing,  Tooth/dental problems, or  Sore throat,                No sneezing, itching, ear ache, nasal congestion, post nasal drip,   CV:  No chest pain,  Orthopnea, PND, swelling in lower extremities, anasarca, dizziness, palpitations, syncope.   GI  No heartburn, indigestion, abdominal pain, nausea, vomiting, diarrhea, change in bowel habits, loss of appetite, bloody stools.   Resp: + shortness of breath with exertion or at rest.  No excess mucus, no productive cough,  No non-productive cough,  No coughing up of blood.  No change in color of mucus.  No wheezing.  No chest wall deformity  Skin: no rash or lesions.  GU: no dysuria, change in color of urine, no urgency or frequency.  No flank pain, no hematuria   MS:  No  joint pain or swelling.  No decreased range of motion.  No back pain.  Psych:  No change in mood or affect. No depression or anxiety.  No memory loss.   Vital Signs BP 118/76 (BP Location: Right Arm, Cuff Size: Normal)   Pulse 84   Ht  (1.778 m)   Wt 223 lb (101.2 kg)   SpO2 94%   BMI 32.00 kg/m    Physical Exam:  General- No distress,  A&Ox3, pleasant ENT: No sinus tenderness, TM clear, pale nasal mucosa, no oral exudate,no post nasal drip, no LAN Cardiac: S1, S2, regular rate and rhythm, no murmur Chest: No wheeze/ rales/ dullness; no accessory muscle use, no nasal flaring, no sternal retractions Abd.: Soft Non-tender Ext: No clubbing cyanosis, edema Neuro:  normal strength Skin: No rashes, warm and dry Psych: normal mood and behavior   Assessment/Plan  Sarcoid Mild progression per CT and PFT's Worsening dyspnea Plan: CT and PFT's show mild progression since 2012. We will check an ACE  today. We will start you on a trial of prednisone and schedule you for a follow up appointment  to assess whether or not you are better. Prednisone 30 mg daily , take in the morning x 3 weeks . Follow up in 3 weeks to assess how you are doing with either Romell Cavanah or Dr. Craige Cotta. Please contact office for sooner follow up if symptoms do not improve or worsen or seek emergency care    Obstructive sleep apnea   Continue on CPAP at bedtime. You appear to be benefiting from the treatment Goal is to wear for at least 6 hours each night for maximal clinical benefit. Continue to work on weight loss, as the link between excess weight  and sleep apnea is well established.  Do not drive if sleepy. Remember to clean mask, tubing, filter, and reservoir once weekly with soapy water.  [facility] as scheduled.     Bevelyn Ngo, NP 12/18/2017  3:12 PM

## 2017-12-18 NOTE — Progress Notes (Signed)
PFT completed 12/18/17  

## 2017-12-18 NOTE — Patient Instructions (Addendum)
It is nice to meet you today. CT and PFT's show mild progression since 2012. We will check an ACE today. We will start you on a trial of prednisone and schedule you for a follow up appointment  to assess whether or not you are better. Prednisone 30 mg daily , take in the morning x 3 weeks . Follow up in 3 weeks to assess how you are doing with either Alfred Stephenson or Dr. Craige Cotta. Please contact office for sooner follow up if symptoms do not improve or worsen or seek emergency care

## 2017-12-18 NOTE — Assessment & Plan Note (Signed)
Mild progression per CT and PFT's Worsening dyspnea Plan: CT and PFT's show mild progression since 2012. We will check an ACE today. We will start you on a trial of prednisone and schedule you for a follow up appointment  to assess whether or not you are better. Prednisone 30 mg daily , take in the morning x 3 weeks . Follow up in 3 weeks to assess how you are doing with either Dennice Tindol or Dr. Craige Cotta. Please contact office for sooner follow up if symptoms do not improve or worsen or seek emergency care

## 2017-12-19 LAB — ANGIOTENSIN CONVERTING ENZYME: ANGIOTENSIN-CONVERTING ENZYME: 47 U/L (ref 9–67)

## 2017-12-19 NOTE — Progress Notes (Signed)
Reviewed and agree with assessment/plan.   Christphor Groft, MD [facility] 08/01/2016, 12:24 PM Pager:  [phone]  

## 2018-01-01 ENCOUNTER — Encounter: Payer: Self-pay | Admitting: Cardiology

## 2018-01-01 ENCOUNTER — Ambulatory Visit (INDEPENDENT_AMBULATORY_CARE_PROVIDER_SITE_OTHER): Payer: Medicare Other | Admitting: Cardiology

## 2018-01-01 VITALS — BP 132/70 | HR 71 | Ht 71.0 in | Wt 223.1 lb

## 2018-01-01 DIAGNOSIS — D869 Sarcoidosis, unspecified: Secondary | ICD-10-CM

## 2018-01-01 DIAGNOSIS — R0609 Other forms of dyspnea: Secondary | ICD-10-CM | POA: Diagnosis not present

## 2018-01-01 DIAGNOSIS — G4769 Other sleep related movement disorders: Secondary | ICD-10-CM | POA: Diagnosis not present

## 2018-01-01 DIAGNOSIS — I4510 Unspecified right bundle-branch block: Secondary | ICD-10-CM

## 2018-01-01 NOTE — Patient Instructions (Signed)
Medication Instructions:  Your physician recommends that you continue on your current medications as directed. Please refer to the Current Medication list given to you today.   Labwork: None  Testing/Procedures: None  Follow-Up: Your physician wants you to follow-up in: 6 months. You will receive a reminder letter in the mail two months in advance. If you don't receive a letter, please call our office to schedule the follow-up appointment.   If you need a refill on your cardiac medications before your next appointment, please call your pharmacy.   Thank you for choosing [facility]! Catherine Lockhart, RN [phone]    

## 2018-01-01 NOTE — Progress Notes (Signed)
Cardiology Office Note:    Date:  01/01/2018   ID:  Alfred Stephenson, DOB [birth year], MRN [id number]  PCP:  Jarome Matin, MD  Cardiologist:  Gypsy Balsam, MD    Referring MD: Jarome Matin, MD   Chief Complaint  Patient presents with  . Follow-up  Doing well  History of Present Illness:    Alfred Stephenson is a 67 y.o. male with dyspnea on exertion quite extensive cardiac evaluation has been done and no clear-cut reason for his symptoms were identified.  I referred him to pulmonary and he was fine to have progressing sarcoidosis therapy was initiated.  He said he is doing fine does have some exertional shortness of breath at the same time he is able to do a lot and doing better overall.  No chest pain no tightness no squeezing or pressure no burning in the chest  Past Medical History:  Diagnosis Date  . Anxiety   . Arthritis    arthritis left index finger   . Chronic kidney disease   . Hyperlipidemia   . Hypertension   . Sarcoidosis   . Sleep apnea    cpap does not know settings     Past Surgical History:  Procedure Laterality Date  . BACK SURGERY  2007  . CARDIAC CATHETERIZATION    . GREEN LIGHT LASER TURP (TRANSURETHRAL RESECTION OF PROSTATE  08/22/2012   Procedure: GREEN LIGHT LASER TURP (TRANSURETHRAL RESECTION OF PROSTATE;  Surgeon: Antony Haste, MD;  Location: [facility];  Service: Urology;  Laterality: N/A;      . LUNG BIOPSY  1970  . NASAL SINUS SURGERY  2006  . NECK SURGERY  2007    Current Medications: Current Meds  Medication Sig  . amLODipine (NORVASC) 10 MG tablet Take 5 mg by mouth daily.  [name] aspirin EC 81 MG tablet Take 81 mg by mouth daily.  [name] oxybutynin (DITROPAN XL) 15 MG 24 hr tablet Take 15 mg by mouth at bedtime.  . pravastatin (PRAVACHOL) 40 MG tablet Take 40 mg by mouth daily.  . predniSONE (DELTASONE) 10 MG tablet Take 3 tablets (30 mg total) by mouth daily with breakfast.  . PRISTIQ 100 MG 24 hr tablet Take 100 mg by mouth  every morning.      Allergies:   Patient has no known allergies.   Social History   Socioeconomic History  . Marital status: Married    Spouse name: Not on file  . Number of children: Not on file  . Years of education: Not on file  . Highest education level: Not on file  Occupational History  . 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 Financial resource strain: Not on file  . Food insecurity:    Worry: Not on file    Inability: Not on file  . Transportation needs:    Medical: Not on file    Non-medical: Not on file  Tobacco Use  . Smoking status: Never Smoker  . Smokeless tobacco: Never Used  Substance and Sexual Activity  . Alcohol use: No  . Drug use: No  . Sexual activity: Not on file  Lifestyle  . Physical activity:    Days per week: Not on file    Minutes per session: Not on file  . Stress: Not on file  Relationships  . Social connections:    Talks on phone: Not on file    Gets together: Not on file    Attends religious service: Not on file  Active member of club or organization: Not on file    Attends meetings of clubs or organizations: Not on file    Relationship status: Not on file  Other Topics Concern  . Not on file  Social History Narrative  . Not on file     Family History: The patient's family history includes CAD in his father; Heart disease in his father; Stroke in his father. ROS:   Please see the history of present illness.    All 14 point review of systems negative except as described per history of present illness  EKGs/Labs/Other Studies Reviewed:      Recent Labs: No results found for requested labs within last 8760 hours.  Recent Lipid Panel No results found for: CHOL, TRIG, HDL, CHOLHDL, VLDL, LDLCALC, LDLDIRECT  Physical Exam:    VS:  BP 132/70   Pulse 71   Ht  (1.803 m)   Wt 223 lb 1.9 oz (101.2 kg)   SpO2 98%   BMI 31.12 kg/m     Wt Readings from Last 3 Encounters:  01/01/18 223 lb 1.9 oz (101.2 kg)   12/18/17 223 lb (101.2 kg)  11/19/17 219 lb (99.3 kg)     GEN:  Well nourished, well developed in no acute distress HEENT: Normal NECK: No JVD; No carotid bruits LYMPHATICS: No lymphadenopathy CARDIAC: RRR, no murmurs, no rubs, no gallops RESPIRATORY:  Clear to auscultation without rales, wheezing or rhonchi  ABDOMEN: Soft, non-tender, non-distended MUSCULOSKELETAL:  No edema; No deformity  SKIN: Warm and dry LOWER EXTREMITIES: no swelling NEUROLOGIC:  Alert and oriented x 3 PSYCHIATRIC:  Normal affect   ASSESSMENT:    1. Dyspnea on exertion   2. Sarcoid   3. Sleep-related movement disorder   4. Right bundle branch block    PLAN:    In order of problems listed above:  1. Dyspnea on exertion: Multifactorial but looks like sarcoidosis at the leading potential cause for now.  Is being managed by pulmonary 2. Sleep-related movement disorder with sleep apnea.  He has been using CPAP mask on the regular basis however he cannot tell me if he feels any better. 3. Bundle branch block: Chronic problem noted no intervention needed for now.  Him back in my office 6 months or sooner if he has a problem   Medication Adjustments/Labs and Tests Ordered: Current medicines are reviewed at length with the patient today.  Concerns regarding medicines are outlined above.  No orders of the defined types were placed in this encounter.  Medication changes: No orders of the defined types were placed in this encounter.   Signed, Georgeanna Lea, MD, [facility] 01/01/2018 11:32 AM    [facility]

## 2018-01-08 ENCOUNTER — Ambulatory Visit: Payer: Medicare Other | Admitting: Acute Care

## 2018-01-08 NOTE — Progress Notes (Deleted)
History of Present Illness Alfred Stephenson is a 67 y.o. male  never smoker with dyspnea and history of sarcoidosis ( Biopsy proven 1970's), and OSA ( Followed by [facility])  He is followed by Dr. Craige Cotta.   01/08/2018  Pt. Presents for follow up. He was last seen 12/2017 to discuss CT scan and the PFT's  showed  some progression of his sarcoid. He was agreeable to a prednisone course with short term follow up to see if he is better, with goal of lowest dose for the shortest period of time. He presents today stating he has been complaint with the prednisone 30 mg daily .          Plan: Drop to 20 mg daily x 2 weeks, then 10 mg daily x 2 weeks then follow up in 4 weeks to see if we can taper to 5 mg and off over the following  month.                                                                       Test Results: ACE 12/2017>> 47 U/L  CXR 12/18/2017 IMPRESSION: Stable upper lobe predominant reticulonodular opacities compatible with sarcoidosis. No new acute pulmonary process identified.  Pulmonary tests: ACE 12/18/2017>> ACE 04/17/11 >> 53  PFT 12/18/2017>>FEV1 3.50 ( 105%), F/F Ratio 81 or 110%, TLC 6.53 ( 93%) DLCO 95% PFT 06/19/11 >> FEV1 3.98 (121%), FEV1% 80, TLC 7.73 (113%), DLCO 120% >> Normal spirometry  Chest imaging: CT Chest 12/11/2017>>  Upper lung predominant pulmonary parenchymal findings compatible with sarcoidosis including mild upper lobe fibrosis, mildly progressed since 2012 chest CT. 2. Mild-to-moderate patchy air trapping in both lungs, indicative of small airways disease, also attributable pulmonary sarcoidosis. 3. Mild mediastinal and bilateral hilar adenopathy, stable since 2012, compatible with sarcoidosis. 4. Two-vessel coronary atherosclerosis. 5. Small hiatal hernia.  CT chest 03/13/11 >> multiple non-calcified nodules primarily in upper lungs with patchy GGO CT chest 07/06/11 >> no change  Sleep tests: PSG 06/25/17 >> AHI 17.6 Bipap  titration 08/01/17 >> Bipap 9/5 cm H2O  Cardiac tests: Echo 07/09/17 >> EF 55 to 60%, grade 2 DD, PAS 23 mmHg, MV Trivial regurg Echo Stress test:>> There were no stress arrhythmias or conduction abnormalities. The stress ECG was negative for ischemia.  Staged echo: There was no echocardiographic evidence for stress-induced ischemia.    CBC Latest Ref Rng & Units 08/15/2012 11/13/2009 11/13/2009  WBC 4.0 - 10.5 K/uL 5.0 - 9.2  Hemoglobin 13.0 - 17.0 g/dL 16.1 17.3(H) 16.5  Hematocrit 39.0 - 52.0 % 45.5 51.0 48.5  Platelets 150 - 400 K/uL 229 - 208    BMP Latest Ref Rng & Units 08/15/2012 11/13/2009 11/17/2008  Glucose 70 - 99 mg/dL 096(E) 99 454(U)  BUN 6 - 23 mg/dL 17 22 22   Creatinine 0.50 - 1.35 mg/dL 9.81 1.5 1.9(J)  Sodium 135 - 145 mEq/L 138 140 137  Potassium 3.5 - 5.1 mEq/L 4.0 4.1 3.3(L)  Chloride 96 - 112 mEq/L 101 106 104  CO2 19 - 32 mEq/L 27 - -  Calcium 8.4 - 10.5 mg/dL 9.8 - -    BNP No results found for: BNP  ProBNP    Component Value Date/Time   PROBNP  44.0 08/31/2008 2113    PFT    Component Value Date/Time   FEV1PRE 3.50 12/18/2017 1154   FEV1POST 3.23 12/18/2017 1154   FVCPRE 4.29 12/18/2017 1154   FVCPOST 3.96 12/18/2017 1154   TLC 6.53 12/18/2017 1154   DLCOUNC 30.91 12/18/2017 1154   PREFEV1FVCRT 81 12/18/2017 1154   PSTFEV1FVCRT 82 12/18/2017 1154    Ct Chest High Resolution  Result Date: 12/11/2017 CLINICAL DATA:  Sarcoidosis, follow-up. EXAM: CT CHEST WITHOUT CONTRAST TECHNIQUE: Multidetector CT imaging of the chest was performed following the standard protocol without intravenous contrast. High resolution imaging of the lungs, as well as inspiratory and expiratory imaging, was performed. COMPARISON:  11/19/2017 chest radiograph.  07/06/2011 chest CT. FINDINGS: Cardiovascular: Normal heart size. No significant pericardial effusion/thickening. Left anterior descending and left circumflex coronary atherosclerosis. Atherosclerotic  nonaneurysmal thoracic aorta. Normal caliber pulmonary arteries. Mediastinum/Nodes: No discrete thyroid nodules. Unremarkable esophagus. No axillary adenopathy. Coarsely calcified AP window node is stable. Mildly enlarged non calcified bilateral paratracheal and subcarinal lymph nodes are stable. Representative 1.0 cm right paratracheal node (series 16/image 25), 1.2 cm left paratracheal node (series 16/image 46) and 1.0 cm subcarinal node (series 16/image 60), all stable since 07/06/2011 chest CT. Mild bilateral hilar adenopathy is poorly delineated on these noncontrast images and appears stable. Lungs/Pleura: No pneumothorax. No pleural effusion. There is patchy prominent nodular thickening of the central and peripheral peribronchovascular interstitium in both lungs, predominantly involving the upper lobes with relative sparing of the lung bases. There is associated mild architectural distortion and volume loss in the upper lungs. There is additional patchy finely nodular ground-glass opacity throughout the bilateral upper lungs, also involving the central and peripheral peribronchovascular regions. There is scattered fine nodularity along fissures and peripheral pleural surfaces bilaterally. Mild traction bronchiectasis is present in the posterior right upper lobe. There is mild progression of these findings since 07/06/2011 chest CT. For example clustered nodularity in the posterior right upper lung measures 2.6 x 1.5 cm (series 17/image 38), increased from 1.9 x 1.2 cm. Irregular nodularity in left upper lobe measuring 2.1 x 1.2 cm (series 17/image 27), increased from 1.6 x 1.1 cm. Posterior right lower lobe irregular 1.2 cm nodule (series 17/image 85), minimally increased from 1.1 cm. No frank honeycombing. Mild to moderate patchy air trapping in both lungs on the expiration sequence. Upper abdomen: Small hiatal hernia. Musculoskeletal: No aggressive appearing focal osseous lesions. Moderate thoracic  spondylosis. Partially visualized surgical hardware from ACDF in the lower cervical spine. IMPRESSION: 1. Upper lung predominant pulmonary parenchymal findings compatible with sarcoidosis including mild upper lobe fibrosis, mildly progressed since 2012 chest CT. 2. Mild-to-moderate patchy air trapping in both lungs, indicative of small airways disease, also attributable pulmonary sarcoidosis. 3. Mild mediastinal and bilateral hilar adenopathy, stable since 2012, compatible with sarcoidosis. 4. Two-vessel coronary atherosclerosis. 5. Small hiatal hernia. Aortic Atherosclerosis (ICD10-I70.0). Electronically Signed   By: Delbert Phenix M.D.   On: 12/11/2017 14:36     Past medical hx Past Medical History:  Diagnosis Date  . Anxiety   . Arthritis    arthritis left index finger   . Chronic kidney disease   . Hyperlipidemia   . Hypertension   . Sarcoidosis   . Sleep apnea    cpap does not know settings      Social History   Tobacco Use  . Smoking status: Never Smoker  . Smokeless tobacco: Never Used  Substance Use Topics  . Alcohol use: No  . Drug use: No  Alfred Stephenson reports that he has never smoked. He has never used smokeless tobacco. He reports that he does not drink alcohol or use drugs.  Tobacco Cessation: Never smoker  Past surgical hx, Family hx, Social hx all reviewed.  Current Outpatient Medications on File Prior to Visit  Medication Sig  . amLODipine (NORVASC) 10 MG tablet Take 5 mg by mouth daily.  [name]. aspirin EC 81 MG tablet Take 81 mg by mouth daily.  [name]. oxybutynin (DITROPAN XL) 15 MG 24 hr tablet Take 15 mg by mouth at bedtime.  . pravastatin (PRAVACHOL) 40 MG tablet Take 40 mg by mouth daily.  . predniSONE (DELTASONE) 10 MG tablet Take 3 tablets (30 mg total) by mouth daily with breakfast.  . PRISTIQ 100 MG 24 hr tablet Take 100 mg by mouth every morning.    No current facility-administered medications on file prior to visit.      No Known Allergies  Review Of  Systems:  Constitutional:   No  weight loss, night sweats,  Fevers, chills, fatigue, or  lassitude.  HEENT:   No headaches,  Difficulty swallowing,  Tooth/dental problems, or  Sore throat,                No sneezing, itching, ear ache, nasal congestion, post nasal drip,   CV:  No chest pain,  Orthopnea, PND, swelling in lower extremities, anasarca, dizziness, palpitations, syncope.   GI  No heartburn, indigestion, abdominal pain, nausea, vomiting, diarrhea, change in bowel habits, loss of appetite, bloody stools.   Resp: No shortness of breath with exertion or at rest.  No excess mucus, no productive cough,  No non-productive cough,  No coughing up of blood.  No change in color of mucus.  No wheezing.  No chest wall deformity  Skin: no rash or lesions.  GU: no dysuria, change in color of urine, no urgency or frequency.  No flank pain, no hematuria   MS:  No joint pain or swelling.  No decreased range of motion.  No back pain.  Psych:  No change in mood or affect. No depression or anxiety.  No memory loss.   Vital Signs There were no vitals taken for this visit.   Physical Exam:  General- No distress,  A&Ox3 ENT: No sinus tenderness, TM clear, pale nasal mucosa, no oral exudate,no post nasal drip, no LAN Cardiac: S1, S2, regular rate and rhythm, no murmur Chest: No wheeze/ rales/ dullness; no accessory muscle use, no nasal flaring, no sternal retractions Abd.: Soft Non-tender Ext: No clubbing cyanosis, edema Neuro:  normal strength Skin: No rashes, warm and dry Psych: normal mood and behavior   Assessment/Plan  No problem-specific Assessment & Plan notes found for this encounter.    Bevelyn NgoSarah F Adalida Garver, NP 01/08/2018  10:34 AM

## 2018-01-13 ENCOUNTER — Ambulatory Visit: Payer: Medicare Other | Admitting: Acute Care

## 2018-01-20 ENCOUNTER — Ambulatory Visit (INDEPENDENT_AMBULATORY_CARE_PROVIDER_SITE_OTHER): Payer: Medicare Other | Admitting: Acute Care

## 2018-01-20 ENCOUNTER — Encounter: Payer: Self-pay | Admitting: Acute Care

## 2018-01-20 VITALS — BP 110/72 | HR 94 | Ht 70.0 in | Wt 220.4 lb

## 2018-01-20 DIAGNOSIS — D869 Sarcoidosis, unspecified: Secondary | ICD-10-CM

## 2018-01-20 DIAGNOSIS — R0609 Other forms of dyspnea: Secondary | ICD-10-CM | POA: Diagnosis not present

## 2018-01-20 DIAGNOSIS — G4733 Obstructive sleep apnea (adult) (pediatric): Secondary | ICD-10-CM

## 2018-01-20 NOTE — Assessment & Plan Note (Signed)
Continued Dyspnea after trial on prednisone  Prednisone did not improve dyspnea ACE >> 47 U/L PFT's normal Suspect this is secondary to deconditioning Plan: Your pulmonary function tests are normal. Your ACE level was normal with last check We will refer you to Pulmonary rehab ( [facility])>> [facility] Additionally, think about [facility]. Follow up with Dr. Craige CottaSood in 6 months Please contact office for sooner follow up if symptoms do not improve or worsen or seek emergency care

## 2018-01-20 NOTE — Progress Notes (Signed)
History of Present Illness Alfred Stephenson is a 67 y.o. male  never smoker with dyspnea and history of sarcoidosis ( Biopsy proven 1970's), and OSA ( [facility]  He is followed by Dr. Craige CottaSood.    01/20/2018  Pt. Was seen 12/18/2017 for continued dyspnea . We did a trial of prednisone to determine if his dyspnea  was sarcoid related ( He has some mild progression on CT since 2012) , and he states that the prednisone made no difference to his dyspnea.  He states he did take the medication as prescribed. He states he continues to have dyspnea, especially with bending over and heavy lifting/pushing. His ACE level was not elevated at the time. PFT's are normal. He is interested in Pulmonary rehab as a possible option to improve his conditioning. He states he first noticed the shortness of breath after trying to push his wife ( weight 260 lbs) [place] a [place]  [place]  [place]. He is also  interested in taking advantage of the [employer]. He has had an extensive cardiac work up that has been negative.He denies any fever, chest pain, orthopnea or hemoptysis. Pt. Is [facility] OSA  [facility] Dr. Vickey Hugerohmeier  Test Results:  Down Load:>> [facility] November 20, 2017 through January 19, 2018 Usage 61/60 1 days or 100% Greater than 4 hours 60 days or 98% Less than 4 hours 1 day or 2% Average usage 8 hours 38 minutes Air curve 10 V auto IPAP 9 cm H2O EPAP 5 cm H2O Mode spontaneous Easy breathing on  AHI equals 6.1  PFT's 12/18/2017 Normal Spirometry  CT Chest 12/11/2017 Upper lung predominant pulmonary parenchymal findings compatible with sarcoidosis including mild upper lobe fibrosis, mildly progressed since 2012 chest CT. 2. Mild-to-moderate patchy air trapping in both lungs, indicative of small airways disease, also attributable pulmonary sarcoidosis. 3. Mild mediastinal and bilateral hilar adenopathy,  stable since 2012, compatible with sarcoidosis. 4. Two-vessel coronary atherosclerosis. 5. Small hiatal hernia.  ACE Level 12/18/2017>> 47 U/L  Echo/Stress Test 08/12/2017>> Stress echo results:     Left ventricular ejection fraction was normal at rest and with stress. There was no echocardiographic evidence for stress-induced ischemia.  There were no stress arrhythmias or conduction abnormalities.  The stress ECG was negative for ischemia.  Echo 07/2017>> Left ventricle: The cavity size was normal. Wall thickness was   normal. Systolic function was normal. The estimated ejection   fraction was in the range of 55% to 60%. Wall motion was normal;   there were no regional wall motion abnormalities. Features are   consistent with a pseudonormal left ventricular filling pattern,   with concomitant abnormal relaxation and increased filling   pressure (grade 2 diastolic dysfunction). - Aortic valve: There was no stenosis. - Mitral valve: There was trivial regurgitation. - Right ventricle: The cavity size was normal. Systolic function   was normal. - Tricuspid valve: Peak RV-RA gradient (S): 20 mm Hg. - Pulmonary arteries: PA peak pressure: 23 mm Hg (S). - Inferior vena cava: The vessel was normal in size. The   respirophasic diameter changes were in the normal range (>= 50%),   consistent with normal central venous pressure.  CBC Latest Ref Rng & Units 08/15/2012 11/13/2009 11/13/2009  WBC 4.0 - 10.5 K/uL 5.0 - 9.2  Hemoglobin 13.0 - 17.0 g/dL 40.916.2 17.3(H) 16.5  Hematocrit 39.0 - 52.0 % 45.5 51.0 48.5  Platelets  150 - 400 K/uL 229 - 208    BMP Latest Ref Rng & Units 08/15/2012 11/13/2009 11/17/2008  Glucose 70 - 99 mg/dL 960(A) 99 540(J)  BUN 6 - 23 mg/dL 17 22 22   Creatinine 0.50 - 1.35 mg/dL 8.11 1.5 9.1(Y)  Sodium 135 - 145 mEq/L 138 140 137  Potassium 3.5 - 5.1 mEq/L 4.0 4.1 3.3(L)  Chloride 96 - 112 mEq/L 101 106 104  CO2 19 - 32 mEq/L 27 - -  Calcium 8.4 - 10.5 mg/dL 9.8 - -     BNP No results found for: BNP  ProBNP    Component Value Date/Time   PROBNP 44.0 08/31/2008 2113    PFT    Component Value Date/Time   FEV1PRE 3.50 12/18/2017 1154   FEV1POST 3.23 12/18/2017 1154   FVCPRE 4.29 12/18/2017 1154   FVCPOST 3.96 12/18/2017 1154   TLC 6.53 12/18/2017 1154   DLCOUNC 30.91 12/18/2017 1154   PREFEV1FVCRT 81 12/18/2017 1154   PSTFEV1FVCRT 82 12/18/2017 1154    No results found.   Past medical hx Past Medical History:  Diagnosis Date  . Anxiety   . Arthritis    arthritis left index finger   . Chronic kidney disease   . Hyperlipidemia   . Hypertension   . Sarcoidosis   . Sleep apnea    cpap does not know settings      Social History   Tobacco Use  . Smoking status: Never Smoker  . Smokeless tobacco: Never Used  Substance Use Topics  . Alcohol use: No  . Drug use: No    Alfred Stephenson reports that he has never smoked. He has never used smokeless tobacco. He reports that he does not drink alcohol or use drugs.  Tobacco Cessation: Never smoker  Past surgical hx, Family hx, Social hx all reviewed.  Current Outpatient Medications on File Prior to Visit  Medication Sig  . amLODipine (NORVASC) 10 MG tablet Take 5 mg by mouth daily.  [name] aspirin EC 81 MG tablet Take 81 mg by mouth daily.  [name] oxybutynin (DITROPAN XL) 15 MG 24 hr tablet Take 15 mg by mouth at bedtime.  . pravastatin (PRAVACHOL) 40 MG tablet Take 40 mg by mouth daily.  [name] PRISTIQ 100 MG 24 hr tablet Take 100 mg by mouth every morning.    No current facility-administered medications on file prior to visit.      No Known Allergies  Review Of Systems:  Constitutional:   No  weight loss, night sweats,  Fevers, chills, fatigue, or  lassitude.  HEENT:   No headaches,  Difficulty swallowing,  Tooth/dental problems, or  Sore throat,                No sneezing, itching, ear ache, nasal congestion, post nasal drip,   CV:  No chest pain,  Orthopnea, PND, swelling in lower  extremities, anasarca, dizziness, palpitations, syncope.   GI  No heartburn, indigestion, abdominal pain, nausea, vomiting, diarrhea, change in bowel habits, loss of appetite, bloody stools.   Resp: + shortness of breath with exertion less at rest.  No excess mucus, no productive cough,  No non-productive cough,  No coughing up of blood.  No change in color of mucus.  No wheezing.  No chest wall deformity  Skin: no rash or lesions.  GU: no dysuria, change in color of urine, no urgency or frequency.  No flank pain, no hematuria   MS:  No joint pain or swelling.  No decreased  range of motion.  No back pain.  Psych:  No change in mood or affect. No depression or anxiety.  No memory loss.   Vital Signs BP 110/72 (BP Location: Left Arm, Cuff Size: Normal)   Pulse 94   Ht 5\' 10"  (1.778 m)   Wt 220 lb 6.4 oz (100 kg)   SpO2 99%   BMI 31.62 kg/m    Physical Exam:  General- No distress,  A&Ox3, appropriate ENT: No sinus tenderness, TM clear, pale nasal mucosa, no oral exudate,no post nasal drip, no LAN Cardiac: S1, S2, regular rate and rhythm, no murmur Chest: No wheeze/ rales/ dullness; no accessory muscle use, no nasal flaring, no sternal retractions, slightly diminished per bases Abd.: Soft Non-tender, BS +, ND, non-obese Ext: No clubbing cyanosis, edema Neuro:  normal strength, MAE x 4, A&O x 3 Skin: No rashes, warm and dry Psych: normal mood and behavior   Assessment/Plan  Dyspnea on exertion Continued Dyspnea after trial on prednisone  Prednisone did not improve dyspnea ACE >> 47 U/L PFT's normal Suspect this is secondary to deconditioning Plan: Your pulmonary function tests are normal. Your ACE level was normal with last check We will refer you to Pulmonary rehab ( [facility] Additionally, think about using the Silver Slipper program at the [facility] for additional exercise. Follow up with Dr. Craige Cotta in 6 months Please contact office for sooner  follow up if symptoms do not improve or worsen or seek emergency care      Obstructive sleep apnea BiPAP Management per [facility] Continue on CPAP at bedtime. [facility] We will [facility] in [facility] Goal is to wear for at least 6 hours each night for maximal clinical benefit. Continue to work on weight loss, as the link between excess weight  and sleep apnea is well established.  Do not drive if sleepy. Remember to clean mask, tubing, filter, and reservoir once weekly with soapy water.  Follow up with Dr. Craige Cotta  In 6 months or before as needed.      Bevelyn Ngo, NP 01/20/2018  4:05 PM

## 2018-01-20 NOTE — Patient Instructions (Addendum)
It is good to see you today. Your pulmonary function tests are normal. Your ACE level was normal with last check We will refer you to Pulmonary rehab ( [facility] Additionally, think about using [facility] for [facility]. Follow up with Dr. Craige CottaSood in 6 months Please contact office for sooner follow up if symptoms do not improve or worsen or seek emergency care    Continue on CPAP at bedtime. [facility]) [facility] Goal is to wear for at least 6 hours each night for maximal clinical benefit. Continue to work on weight loss, as the link between excess weight  and sleep apnea is well established.  Do not drive if sleepy. Remember to clean mask, tubing, filter, and reservoir once weekly with soapy water.  Follow up with Dr. Craige CottaSood  In 6 months or before as needed.

## 2018-01-20 NOTE — Assessment & Plan Note (Signed)
[facility] Continue on CPAP at bedtime. You [facility] to be [facility]) [facility] Goal is to wear for at least 6 hours each night for maximal clinical benefit. Continue to work on weight loss, as the link between excess weight  and sleep apnea is well established.  Do not drive if sleepy. Remember to clean mask, tubing, filter, and reservoir once weekly with soapy water.  Follow up with Dr. Craige CottaSood  In 6 months or before as needed.

## 2018-01-24 ENCOUNTER — Telehealth (HOSPITAL_COMMUNITY): Payer: Self-pay

## 2018-01-24 NOTE — Telephone Encounter (Signed)
Called patient to [facility] and [facility]. Closed referral.

## 2018-02-03 NOTE — Progress Notes (Signed)
[facility]  PATIENT: Alfred Stephenson DOB: Dec 24, 1950   REASON FOR VISIT: Follow-up for newly diagnosed obstructive sleep apnea with initial BiPAP compliance HISTORY FROM: Patient    HISTORY OF PRESENT ILLNESS:UPDATE 7/2/19[name], 67 year old male returns for follow-up for initial BiPAP compliance.  He does complain with leak.  ESS 3 and his daytime drowsiness has improved.  Data dated 01/04/2018-01/06/2018 shows compliance greater than 4 hours at 100 percent.  Average usage 8 hours 37 minutes.  Pressure 5 to 9 cm leak 38.2%.  AHI 6.6.  He returns for reevaluation obstructive sleep apnea with BiPAP   11/12/18CDGeorge E Stephenson is a 67 y.o. male , seen here as in a referral ( prolonged interval RV ) from Alfred Stephenson  to be re- evaluated for OSA.  I have the pleasure of meeting with Mr. Alfred Stephenson today on 17 June 2017, the patient is an established former sleep lab patient of Dr Maple Stephenson  's  who was evaluated in the year 2007, he had his last study in 2011 with [facility]- he was placed on CPAP, but he did not stay in contact.  He also did use his CPAP for a while and then discontinued after about for 5 years. Alfred Stephenson attached a copy of the sleep study from 10 November 2009, the study was performed as a split-night protocol.  The patient was diagnosed with 119 hypopneas, 18 apneas resulting in an AHI of 60.7, no REM sleep was noted, and oxygen nadir was 86% with only 1.4 minutes of desaturation, he was placed on CPAP but developed central apneas.  There was also a strong supine AHI increase.  We decided to start him on CPAP of 8 cm which had reduced the AHI to 4.0.  He also had a high PLM arousal index was not aware of restless legs at the time.  In the meantime he had a broken nasal septum;  this was repaired in 2007, he also had an posterior neck plate / neck fusion surgery, 2016 he had a TURP,  He carries the diagnosis of sarcoidosis,  and his chief  complaint today is that he is sleeping too much, sometimes short of breath, fatigue and daytime with a decreased level of energy - in the meantime restless legs have emerged. HTN, Hyperlipidemia. CKD 3. OSA, untreated.   Sleep habits are as follows:  His bedtime has been later than in his working years and is now around 10 PM, when he retreats to his bedroom the bedroom is cool, and dark.  Watches TV news in the bedroom however until midnight.  His wife comes into the bedroom later and turns the TV off again.  He is usually on his back while watching TV during the night.  Which to a lateral.  He sleeps on only one pillow. His wife sleeps with an oxygen concentrator.  He may have 1 or 2 times nocturia each night, and he usually can go right back to sleep.  He does sometimes have vivid dreams but he is not known to [place] at night he is not a sleep walker either.  He usually arises in the morning at whenever he feels like it.  Most likely between 10 and 11 AM. The patient estimates that he sleeps more than 10 hours at night, and he may still sleep feel sleepy in the daytime.  This has concerned his wife greatly, he endorsed the Epworth sleepiness score at 8 points, the fatigue severity score  of 27 points in the geriatric depression score at only 2 out of 15 points.    REVIEW OF SYSTEMS: Full 14 system review of systems performed and notable only for those listed, all others are neg:  Constitutional: neg  Cardiovascular: neg Ear/Nose/Throat: neg  Skin: neg Eyes: neg Respiratory: neg Gastroitestinal: neg  Hematology/Lymphatic: neg  Endocrine: neg Musculoskeletal:neg Allergy/Immunology: neg Neurological: neg Psychiatric: Depression Sleep : Obstructive sleep apnea with BiPAP   ALLERGIES: No Known Allergies  HOME MEDICATIONS: Outpatient Medications Prior to Visit  Medication Sig Dispense Refill  . amLODipine (NORVASC) 10 MG tablet Take 5 mg by mouth daily.    [name] aspirin EC 81 MG tablet Take 81  mg by mouth daily.    [name] oxybutynin (DITROPAN XL) 15 MG 24 hr tablet Take 15 mg by mouth at bedtime.    . pravastatin (PRAVACHOL) 40 MG tablet Take 40 mg by mouth daily.    [name] PRISTIQ 100 MG 24 hr tablet Take 100 mg by mouth every morning.      No facility-administered medications prior to visit.     PAST MEDICAL HISTORY: Past Medical History:  Diagnosis Date  . Anxiety   . Arthritis    arthritis left index finger   . Chronic kidney disease   . Hyperlipidemia   . Hypertension   . Sarcoidosis   . Sleep apnea    cpap does not know settings, 02/04/18 BiPAP    PAST SURGICAL HISTORY: Past Surgical History:  Procedure Laterality Date  . BACK SURGERY  2007  . CARDIAC CATHETERIZATION    . GREEN LIGHT LASER TURP (TRANSURETHRAL RESECTION OF PROSTATE  08/22/2012   Procedure: GREEN LIGHT LASER TURP (TRANSURETHRAL RESECTION OF PROSTATE;  Surgeon: Alfred Haste, MD;  Location: [facility];  Service: Urology;  Laterality: N/A;      . LUNG BIOPSY  1970  . NASAL SINUS SURGERY  2006  . NECK SURGERY  2007    FAMILY HISTORY: Family History  Problem Relation Age of Onset  . Heart disease Father   . Stroke Father   . CAD Father     SOCIAL HISTORY: Social History   Socioeconomic History  . Marital status: Married    Spouse name: Alfred Stephenson  . Number of children: Not on file  . Years of education: Not on file  . Highest education level: Not on file  Occupational History  . 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 Financial resource strain: Not on file  . Food insecurity:    Worry: Not on file    Inability: Not on file  . Transportation needs:    Medical: Not on file    Non-medical: Not on file  Tobacco Use  . Smoking status: Never Smoker  . Smokeless tobacco: Never Used  Substance and Sexual Activity  . Alcohol use: No  . Drug use: No  . Sexual activity: Not on file  Lifestyle  . Physical activity:    Days per week: Not on file    Minutes per session: Not on file   . Stress: Not on file  Relationships  . Social connections:    Talks on phone: Not on file    Gets together: Not on file    Attends religious service: Not on file    Active member of club or organization: Not on file    Attends meetings of clubs or organizations: Not on file    Relationship status: Not on file  . Intimate partner violence:  Fear of current or ex partner: Not on file    Emotionally abused: Not on file    Physically abused: Not on file    Forced sexual activity: Not on file  Other Topics Concern  . Not on file  Social History Narrative  . Not on file     PHYSICAL EXAM  Vitals:   02/04/18 0949  BP: 133/81  Pulse: 69  Weight: 222 lb 3.2 oz (100.8 kg)  Height: 5\' 10"  (1.778 m)   Body mass index is 31.88 kg/m.  Generalized: Well developed, obese male in no acute distress  Head: normocephalic and atraumatic,. Oropharynx benign mallopatti2 Neck: Supple, circumference 17.5 Lungs clear Skin no peripheral edema Musculoskeletal: No deformity   Neurological examination   Mentation: Alert oriented to time, place, history taking. Attention span and concentration appropriate. Recent and remote memory intact.  Follows all commands speech and language fluent.   Cranial nerve II-XII: Pupils were equal round reactive to light extraocular movements were full, visual field were full on confrontational test. Facial sensation and strength were normal. hearing was intact to finger rubbing bilaterally. Uvula tongue midline. head turning and shoulder shrug were normal and symmetric.Tongue protrusion into cheek strength was normal. Motor: normal bulk and tone, full strength in the BUE, BLE, fine finger movements normal, no pronator drift. No focal weakness Sensory: normal and symmetric to light touch,  Coordination: finger-nose-finger, heel-to-shin bilaterally, no dysmetria Gait and Station: Rising up from seated position without assistance, normal stance,  moderate stride,  good arm swing, smooth turning, able to perform tiptoe, and heel walking without difficulty. Tandem gait is steady  DIAGNOSTIC DATA (LABS, IMAGING, TESTING) - I reviewed patient records, labs, notes, testing and imaging myself where available.  Lab Results  Component Value Date   WBC 5.0 08/15/2012   HGB 16.2 08/15/2012   HCT 45.5 08/15/2012   MCV 89.2 08/15/2012   PLT 229 08/15/2012      Component Value Date/Time   NA 138 08/15/2012 1150   K 4.0 08/15/2012 1150   CL 101 08/15/2012 1150   CO2 27 08/15/2012 1150   GLUCOSE 136 (H) 08/15/2012 1150   BUN 17 08/15/2012 1150   CREATININE 1.32 08/15/2012 1150   CALCIUM 9.8 08/15/2012 1150   PROT 6.7 11/18/2008 2108   ALBUMIN 3.5 11/18/2008 2108   AST 27 11/18/2008 2108   ALT 22 11/18/2008 2108   ALKPHOS 90 11/18/2008 2108   BILITOT 0.3 11/18/2008 2108   GFRNONAA 56 (L) 08/15/2012 1150   GFRAA 65 (L) 08/15/2012 1150    Lab Results  Component Value Date   TSH 3.070 Test methodology is 3rd generation TSH 11/18/2008      ASSESSMENT AND PLAN  67 y.o. year old male  has a past medical history of Anxiety, Arthritis, Chronic kidney disease, Hyperlipidemia, Hypertension, Sarcoidosis, and Sleep apnea. here to follow-up for initial BiPAP compliance.Data dated 01/04/2018-01/06/2018 shows compliance greater than 4 hours at 100 percent.  Average usage 8 hours 37 minutes.  Pressure 5 to 9 cm leak 38.2%.  AHI 6.6.   PLAN: CPAP compliance 100% Patient has significant leak will get mask refit Follow-up in 4 months Nilda RiggsNancy Carolyn Ann Groeneveld, [facility]NP, [facility], APRN  [facility] [address], [address] [place], [place] [id number] [phone]

## 2018-02-04 ENCOUNTER — Encounter: Payer: Self-pay | Admitting: Nurse Practitioner

## 2018-02-04 ENCOUNTER — Ambulatory Visit (INDEPENDENT_AMBULATORY_CARE_PROVIDER_SITE_OTHER): Payer: Medicare Other | Admitting: Nurse Practitioner

## 2018-02-04 DIAGNOSIS — G4733 Obstructive sleep apnea (adult) (pediatric): Secondary | ICD-10-CM

## 2018-02-04 NOTE — Patient Instructions (Signed)
CPAP compliance 100% Patient has significant leak will get mask refit Follow-up in 4 months

## 2018-02-04 NOTE — Progress Notes (Signed)
Order for mask refit faxed to [facility]. Received a receipt of confirmation.

## 2018-02-10 ENCOUNTER — Telehealth: Payer: Self-pay | Admitting: Pulmonary Disease

## 2018-02-10 NOTE — Telephone Encounter (Signed)
Spoke with pt's spouse Alfred Stephenson (dpr on file), states that pt was not calling regarding pulm rehab but calling to schedule a follow up with [facility].  Pt is not needing an acute visit, but rather wants to schedule his OV from his last visit.  I informed pt wife that pt has a recall in his chart and [facility]' schedule is not open for a December schedule yet.  Spouse expressed understanding. Nothing further needed.

## 2018-02-11 ENCOUNTER — Telehealth: Payer: Self-pay | Admitting: Pulmonary Disease

## 2018-02-11 DIAGNOSIS — D869 Sarcoidosis, unspecified: Secondary | ICD-10-CM

## 2018-02-11 NOTE — Telephone Encounter (Addendum)
[facility] Clifton CustardAaron who stated a referral for cardiac rehab needs to be placed for pt so that way they can be able to call pt to get it schedule.  Per Clifton CustardAaron, they never received an [facility]. I placed another order for [facility].   Nothing further needed.

## 2018-02-11 NOTE — Telephone Encounter (Signed)
[facility]( Aaron, Sherry, or Nettie ElmSylvia [phone], fax # [phone]); states the referral for Cardiac Rehab can be changed and faxed to them so they can call the patient to schedule the appointment.

## 2018-02-24 ENCOUNTER — Telehealth: Payer: Self-pay | Admitting: Pulmonary Disease

## 2018-02-24 NOTE — Telephone Encounter (Signed)
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   [facility] but office is closed.  Will call back 7/23 to relay this info.

## 2018-02-25 NOTE — Telephone Encounter (Signed)
Called and spoke with Alfred Stephenson with [facility] at phone [phone]. 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 Pt is not aware the Medicare is not covering the pulmonary rehab at this time Alfred Stephenson states she will try the claim again, and then call the pt Nothing further needed at this time.

## 2018-06-09 ENCOUNTER — Encounter: Payer: Self-pay | Admitting: Nurse Practitioner

## 2018-06-10 NOTE — Progress Notes (Signed)
[facility]  PATIENT: Alfred Stephenson DOB: 19-Feb-1951   REASON FOR VISIT: Follow-up for obstructive sleep apnea with  BiPAP compliance HISTORY FROM: Patient    HISTORY OF PRESENT ILLNESS:UPDATE 11/6/2019[name] 67 year old male returns for follow-up for BiPAP compliance.  His daytime drowsiness is much improved he has nasal pillows for mask.  Compliance data dated 05/11/2018-06/09/2018 shows compliance greater than 4 hours at 97%.  Average usage 7 hours 45 minutes.  Pressure 5 to 9 cm.  Leak 95th percentile 0.9.  AHI 2.6.  ESS 6 FSS 15. He returns for reevaluation   UPDATE 7/2/19[name], 67 year old male returns for follow-up for initial BiPAP compliance.  He does complain with leak.  ESS 3 and his daytime drowsiness has improved.  Data dated 01/04/2018-01/06/2018 shows compliance greater than 4 hours at 100 percent.  Average usage 8 hours 37 minutes.  Pressure 5 to 9 cm leak 38.2%.  AHI 6.6.  He returns for reevaluation obstructive sleep apnea with BiPAP   11/12/18CDGeorge E Stephenson is a 67 y.o. male , seen here as in a referral ( prolonged interval RV ) from Dr. Eloise Harman  to be re- evaluated for OSA.  I have the pleasure of meeting with Alfred Stephenson today on 17 June 2017, the patient is an established former sleep lab patient of Dr Maple Hudson  's  who was evaluated in the year 2007, he had his last study in 2011 with [facility]- he was placed on CPAP, but he did not stay in contact.  He also did use his CPAP for a while and then discontinued after about for 5 years. Dr. Jarold Motto attached a copy of the sleep study from 10 November 2009, the study was performed as a split-night protocol.  The patient was diagnosed with 119 hypopneas, 18 apneas resulting in an AHI of 60.7, no REM sleep was noted, and oxygen nadir was 86% with only 1.4 minutes of desaturation, he was placed on CPAP but developed central apneas.  There was also a strong supine AHI increase.  We  decided to start him on CPAP of 8 cm which had reduced the AHI to 4.0.  He also had a high PLM arousal index was not aware of restless legs at the time.  In the meantime he had a broken nasal septum;  this was repaired in 2007, he also had an posterior neck plate / neck fusion surgery, 2016 he had a TURP,  He carries the diagnosis of sarcoidosis,  and his chief complaint today is that he is sleeping too much, sometimes short of breath, fatigue and daytime with a decreased level of energy - in the meantime restless legs have emerged. HTN, Hyperlipidemia. CKD 3. OSA, untreated.   Sleep habits are as follows:  His bedtime has been later than in his working years and is now around 10 PM, when he retreats to his bedroom the bedroom is cool, and dark.  Watches TV news in the bedroom however until midnight.  His wife comes into the bedroom later and turns the TV off again.  He is usually on his back while watching TV during the night.  Which to a lateral.  He sleeps on only one pillow. His wife sleeps with an oxygen concentrator.  He may have 1 or 2 times nocturia each night, and he usually can go right back to sleep.  He does sometimes have vivid dreams but he is not known to [place] at night he is  not a sleep walker either.  He usually arises in the morning at whenever he feels like it.  Most likely between 10 and 11 AM. The patient estimates that he sleeps more than 10 hours at night, and he may still sleep feel sleepy in the daytime.  This has concerned his wife greatly, he endorsed the Epworth sleepiness score at 8 points, the fatigue severity score of 27 points in the geriatric depression score at only 2 out of 15 points.    REVIEW OF SYSTEMS: Full 14 system review of systems performed and notable only for those listed, all others are neg:  Constitutional: neg  Cardiovascular: neg Ear/Nose/Throat: Hearing loss Skin: neg Eyes: neg Respiratory: neg Gastroitestinal: neg  Hematology/Lymphatic: neg    Endocrine: neg Musculoskeletal:neg Allergy/Immunology: neg Neurological: neg Psychiatric: Depression Sleep : Obstructive sleep apnea with BiPAP   ALLERGIES: No Known Allergies  HOME MEDICATIONS: Outpatient Medications Prior to Visit  Medication Sig Dispense Refill  . amLODipine (NORVASC) 10 MG tablet Take 5 mg by mouth daily.    [name] aspirin EC 81 MG tablet Take 81 mg by mouth daily.    [name] oxybutynin (DITROPAN XL) 15 MG 24 hr tablet Take 15 mg by mouth at bedtime.    . pravastatin (PRAVACHOL) 40 MG tablet Take 40 mg by mouth daily.    [name] PRISTIQ 100 MG 24 hr tablet Take 100 mg by mouth every morning.      No facility-administered medications prior to visit.     PAST MEDICAL HISTORY: Past Medical History:  Diagnosis Date  . Anxiety   . Arthritis    arthritis left index finger   . Chronic kidney disease   . Hyperlipidemia   . Hypertension   . Sarcoidosis   . Sleep apnea    cpap does not know settings, 02/04/18 BiPAP    PAST SURGICAL HISTORY: Past Surgical History:  Procedure Laterality Date  . BACK SURGERY  2007  . CARDIAC CATHETERIZATION    . GREEN LIGHT LASER TURP (TRANSURETHRAL RESECTION OF PROSTATE  08/22/2012   Procedure: GREEN LIGHT LASER TURP (TRANSURETHRAL RESECTION OF PROSTATE;  Surgeon: Antony Haste, MD;  Location: [facility];  Service: Urology;  Laterality: N/A;      . LUNG BIOPSY  1970  . NASAL SINUS SURGERY  2006  . NECK SURGERY  2007    FAMILY HISTORY: Family History  Problem Relation Age of Onset  . Heart disease Father   . Stroke Father   . CAD Father     SOCIAL HISTORY: Social History   Socioeconomic History  . Marital status: Married    Spouse name: Olegario Messier  . Number of children: Not on file  . Years of education: Not on file  . Highest education level: Not on file  Occupational History  . 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 Financial resource strain: Not on file  . Food insecurity:    Worry: Not on file     Inability: Not on file  . Transportation needs:    Medical: Not on file    Non-medical: Not on file  Tobacco Use  . Smoking status: Never Smoker  . Smokeless tobacco: Never Used  Substance and Sexual Activity  . Alcohol use: No  . Drug use: No  . Sexual activity: Not on file  Lifestyle  . Physical activity:    Days per week: Not on file    Minutes per session: Not on file  . Stress: Not on file  Relationships  . Social connections:    Talks on phone: Not on file    Gets together: Not on file    Attends religious service: Not on file    Active member of club or organization: Not on file    Attends meetings of clubs or organizations: Not on file    Relationship status: Not on file  . Intimate partner violence:    Fear of current or ex partner: Not on file    Emotionally abused: Not on file    Physically abused: Not on file    Forced sexual activity: Not on file  Other Topics Concern  . Not on file  Social History Narrative  . Not on file     PHYSICAL EXAM  Vitals:   06/11/18 0850  BP: 117/79  Pulse: 69  Weight: 220 lb 9.6 oz (100.1 kg)  Height: 5\' 11"  (1.803 m)   Body mass index is 30.77 kg/m.  Generalized: Well developed, obese male in no acute distress  Head: normocephalic and atraumatic,. Oropharynx benign mallopatti2 Neck: Supple, circumference 17.5 Lungs clear Skin no peripheral edema, no rash Musculoskeletal: No deformity   Neurological examination   Mentation: Alert oriented to time, place, history taking. Attention span and concentration appropriate. Recent and remote memory intact.  Follows all commands speech and language fluent.   Cranial nerve II-XII: Pupils were equal round reactive to light extraocular movements were full, visual field were full on confrontational test. Facial sensation and strength were normal. hearing was intact to finger rubbing bilaterally. Uvula tongue midline. head turning and shoulder shrug were normal and symmetric.Tongue  protrusion into cheek strength was normal. Motor: normal bulk and tone, full strength in the BUE, BLE, fine finger movements normal, no pronator drift. No focal weakness Sensory: normal and symmetric to light touch,  Coordination: finger-nose-finger, heel-to-shin bilaterally, no dysmetria Gait and Station: Rising up from seated position without assistance, normal stance,  moderate stride, good arm swing, smooth turning, able to perform tiptoe, and heel walking without difficulty. Tandem gait is steady  DIAGNOSTIC DATA (LABS, IMAGING, TESTING) - I reviewed patient records, labs, notes, testing and imaging myself where available.  Lab Results  Component Value Date   WBC 5.0 08/15/2012   HGB 16.2 08/15/2012   HCT 45.5 08/15/2012   MCV 89.2 08/15/2012   PLT 229 08/15/2012      Component Value Date/Time   NA 138 08/15/2012 1150   K 4.0 08/15/2012 1150   CL 101 08/15/2012 1150   CO2 27 08/15/2012 1150   GLUCOSE 136 (H) 08/15/2012 1150   BUN 17 08/15/2012 1150   CREATININE 1.32 08/15/2012 1150   CALCIUM 9.8 08/15/2012 1150   PROT 6.7 11/18/2008 2108   ALBUMIN 3.5 11/18/2008 2108   AST 27 11/18/2008 2108   ALT 22 11/18/2008 2108   ALKPHOS 90 11/18/2008 2108   BILITOT 0.3 11/18/2008 2108   GFRNONAA 56 (L) 08/15/2012 1150   GFRAA 65 (L) 08/15/2012 1150    Lab Results  Component Value Date   TSH 3.070 Test methodology is 3rd generation TSH 11/18/2008      ASSESSMENT AND PLAN  67 y.o. year old male  has a past medical history of Anxiety, Arthritis, Chronic kidney disease, Hyperlipidemia, Hypertension, Sarcoidosis, and Sleep apnea. here to follow-up for BiPAP compliance. Data dated 05/11/2018-06/09/2018 shows compliance greater than 4 hours at 97%.  Average usage 7 hours 45 minutes.  Pressure 5 to 9 cm.  Leak 95th percentile 0.9.  AHI 2.6.  ESS 6 FSS 15.  PLAN: CPAP compliance 97% Continue same settings Follow-up yearly Nilda Riggs, [facility], [facility], APRN  [facility] [address] [place], [place] [address] [phone]

## 2018-06-11 ENCOUNTER — Encounter: Payer: Self-pay | Admitting: Nurse Practitioner

## 2018-06-11 ENCOUNTER — Ambulatory Visit (INDEPENDENT_AMBULATORY_CARE_PROVIDER_SITE_OTHER): Payer: Medicare Other | Admitting: Nurse Practitioner

## 2018-06-11 VITALS — BP 117/79 | HR 69 | Ht 71.0 in | Wt 220.6 lb

## 2018-06-11 DIAGNOSIS — G4733 Obstructive sleep apnea (adult) (pediatric): Secondary | ICD-10-CM

## 2018-06-11 NOTE — Patient Instructions (Signed)
CPAP compliance 97% Continue same settings Follow-up yearly  

## 2018-07-14 ENCOUNTER — Ambulatory Visit (INDEPENDENT_AMBULATORY_CARE_PROVIDER_SITE_OTHER): Payer: Medicare Other | Admitting: Pulmonary Disease

## 2018-07-14 ENCOUNTER — Encounter: Payer: Self-pay | Admitting: Pulmonary Disease

## 2018-07-14 VITALS — BP 118/82 | HR 65 | Ht 71.0 in | Wt 222.2 lb

## 2018-07-14 DIAGNOSIS — D869 Sarcoidosis, unspecified: Secondary | ICD-10-CM

## 2018-07-14 DIAGNOSIS — R29898 Other symptoms and signs involving the musculoskeletal system: Secondary | ICD-10-CM | POA: Diagnosis not present

## 2018-07-14 DIAGNOSIS — R0609 Other forms of dyspnea: Secondary | ICD-10-CM

## 2018-07-14 NOTE — Patient Instructions (Addendum)
Follow up in 1 year.

## 2018-07-14 NOTE — Progress Notes (Signed)
[facility]  Chief Complaint  Patient presents with  . Follow-up    Dyspnea    Constitutional:  BP 118/82   Pulse 65   Ht 5\' 11"  (1.803 m)   Wt 222 lb 3.2 oz (100.8 kg)   SpO2 98%   BMI 30.99 kg/m   Past Medical History:  Anxiety, CKD, HLD. HTN, OSA  Brief Summary:  Alfred Stephenson is a 67 y.o. male with dyspnea and sarcoidosis.  His breathing is better.  Not having cough, wheeze, sputum, chest pain, skin rash, fever, hemoptysis, leg swelling, or joint swelling.  Did well with cardiopulmonary rehab.  Planning to join the [employer] or [employer].  Physical Exam:   Appearance - well kempt   ENMT - clear nasal mucosa, midline nasal  septum, no oral exudates, no LAN, trachea midline  Respiratory - normal chest wall, normal respiratory effort, no accessory muscle use, no wheeze/rales  CV - s1s2 regular rate and rhythm, no murmurs, no peripheral edema, radial pulses symmetric  GI - soft, non tender, no masses  Lymph - no adenopathy noted in neck and axillary areas  MSK - normal gait  Ext - no cyanosis, clubbing, or joint inflammation noted  Skin - no rashes, lesions, or ulcers  Neuro - normal strength, oriented x 3  Psych - normal mood and affect  Discussion:  His symptoms of dyspnea seem to have improved with regular exercise regimen.  This is in line with main cause of his dyspnea being from deconditioning.  While he does have history of sarcoidosis, this does not seem to be an active issue.  Assessment/Plan:   Dyspnea on exertion. - likely from deconditioning and diastolic CHF - continue exercise regimen  History of pulmonary sarcoidosis. - monitor clinically  Obstructive sleep apnea. - followed by Dr. Vickey Huger with [facility]  Patient Instructions  Follow up in 1 year    [name], MD [facility] Pager: [phone] 07/14/2018, 9:36 AM  Flow Sheet     Pulmonary tests:    ACE 04/17/11 >> 53 PFT 06/19/11 >> FEV1 3.98 (121%), FEV1% 80, TLC 7.73 (113%), DLCO 120% PFT 12/18/17 >> FEV1 3.50 (105%), FEV1% 81, TLC 6.53 (93%), DLCO 95%  Chest imaging:  CT chest 03/13/11 >> multiple non-calcified nodules primarily in upper lungs with patchy GGO CT chest 07/06/11 >> no change HRCT chest 12/11/17 >> atherosclerosis, calcified LAN, patchy nodular thickening b/l interstitium mostly upper lobes, patchy GGO upper lobes, mild traction BTX RUL, air trapping  Sleep tests:  PSG 06/25/17 >> AHI 17.6 Bipap titration 08/01/17 >> Bipap 9/5 cm H2O  Cardiac tests:  Echo 07/09/17 >> EF 55 to 60%, grade 2 DD, PAS 23 mmHg   Medications:   Allergies as of 07/14/2018   No Known Allergies     Medication List        Accurate as of 07/14/18  9:36 AM. Always use your most recent med list.          amLODipine 10 MG tablet Commonly known as:  NORVASC Take 5 mg by mouth daily.   aspirin EC 81 MG tablet Take 81 mg by mouth daily.   oxybutynin 15 MG 24 hr tablet Commonly known as:  DITROPAN XL Take 15 mg by mouth at bedtime.   pravastatin 40 MG tablet Commonly known as:  PRAVACHOL Take 40 mg by mouth daily.   PRISTIQ 100 MG 24 hr tablet Generic drug:  desvenlafaxine Take 100 mg by  mouth every morning.   telmisartan 80 MG tablet Commonly known as:  MICARDIS       Past Surgical History:  He  has a past surgical history that includes Lung biopsy (1970); Neck surgery (2007); Back surgery (2007); Nasal sinus surgery (2006); Green light laser turp (transurethral resection of prostate (08/22/2012); and Cardiac catheterization.  Family History:  His family history includes CAD in his father; Heart disease in his father; Stroke in his father.  Social History:  He  reports that he has never smoked. He has never used smokeless tobacco. He reports that he does not drink alcohol or use drugs.

## 2018-07-15 DIAGNOSIS — Z86010 Personal history of colonic polyps: Secondary | ICD-10-CM | POA: Insufficient documentation

## 2018-07-21 ENCOUNTER — Encounter: Payer: Self-pay | Admitting: Cardiology

## 2018-07-21 ENCOUNTER — Ambulatory Visit (INDEPENDENT_AMBULATORY_CARE_PROVIDER_SITE_OTHER): Payer: Medicare Other | Admitting: Cardiology

## 2018-07-21 VITALS — BP 132/78 | HR 62 | Ht 71.0 in | Wt 220.0 lb

## 2018-07-21 DIAGNOSIS — R0609 Other forms of dyspnea: Secondary | ICD-10-CM

## 2018-07-21 DIAGNOSIS — G4733 Obstructive sleep apnea (adult) (pediatric): Secondary | ICD-10-CM

## 2018-07-21 DIAGNOSIS — I4510 Unspecified right bundle-branch block: Secondary | ICD-10-CM | POA: Diagnosis not present

## 2018-07-21 DIAGNOSIS — D869 Sarcoidosis, unspecified: Secondary | ICD-10-CM

## 2018-07-21 DIAGNOSIS — E785 Hyperlipidemia, unspecified: Secondary | ICD-10-CM

## 2018-07-21 LAB — BASIC METABOLIC PANEL
BUN/Creatinine Ratio: 13 (ref 10–24)
BUN: 21 mg/dL (ref 8–27)
CO2: 20 mmol/L (ref 20–29)
Calcium: 9.9 mg/dL (ref 8.6–10.2)
Chloride: 100 mmol/L (ref 96–106)
Creatinine, Ser: 1.58 mg/dL — ABNORMAL HIGH (ref 0.76–1.27)
GFR calc Af Amer: 51 mL/min/{1.73_m2} — ABNORMAL LOW (ref >59)
GFR calc non Af Amer: 44 mL/min/{1.73_m2} — ABNORMAL LOW (ref >59)
GLUCOSE: 91 mg/dL (ref 65–99)
POTASSIUM: 5.1 mmol/L (ref 3.5–5.2)
SODIUM: 137 mmol/L (ref 134–144)

## 2018-07-21 LAB — PRO B NATRIURETIC PEPTIDE: NT-PRO BNP: 75 pg/mL (ref 0–376)

## 2018-07-21 NOTE — Progress Notes (Signed)
Cardiology Office Note:    Date:  07/21/2018   ID:  Alfred Stephenson, DOB 16-Dec-1950, MRN [id number]  PCP:  Jarome Matin, MD  Cardiologist:  Gypsy Balsam, MD    Referring MD: Jarome Matin, MD   No chief complaint on file. Doing well  History of Present Illness:    Alfred Stephenson is a 67 y.o. male with sarcoidosis, dyslipidemia, dyspnea on exertion, right bundle branch block comes to the to my office follow-up will doing well described to have some exercise related shortness of breath but no chest pain tightness squeezing pressure burning chest.  Past Medical History:  Diagnosis Date  . Anxiety   . Arthritis    arthritis left index finger   . Chronic kidney disease   . Hyperlipidemia   . Hypertension   . Sarcoidosis   . Sleep apnea    cpap does not know settings, 02/04/18 BiPAP    Past Surgical History:  Procedure Laterality Date  . BACK SURGERY  2007  . CARDIAC CATHETERIZATION    . GREEN LIGHT LASER TURP (TRANSURETHRAL RESECTION OF PROSTATE  08/22/2012   Procedure: GREEN LIGHT LASER TURP (TRANSURETHRAL RESECTION OF PROSTATE;  Surgeon: Antony Haste, MD;  Location: [facility];  Service: Urology;  Laterality: N/A;      . LUNG BIOPSY  1970  . NASAL SINUS SURGERY  2006  . NECK SURGERY  2007    Current Medications: Current Meds  Medication Sig  . amLODipine (NORVASC) 10 MG tablet Take 5 mg by mouth daily.  [name] aspirin EC 81 MG tablet Take 81 mg by mouth daily.  [name] oxybutynin (DITROPAN XL) 15 MG 24 hr tablet Take 15 mg by mouth at bedtime.  . pravastatin (PRAVACHOL) 40 MG tablet Take 40 mg by mouth daily.  [name] PRISTIQ 100 MG 24 hr tablet Take 100 mg by mouth every morning.   [name] telmisartan (MICARDIS) 80 MG tablet Take 80 mg by mouth daily.      Allergies:   Patient has no known allergies.   Social History   Socioeconomic History  . Marital status: Married    Spouse name: Alfred Stephenson  . Number of children: Not on file  . Years of education: Not on file  .  Highest education level: Not on file  Occupational History  . 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 Financial resource strain: Not on file  . Food insecurity:    Worry: Not on file    Inability: Not on file  . Transportation needs:    Medical: Not on file    Non-medical: Not on file  Tobacco Use  . Smoking status: Never Smoker  . Smokeless tobacco: Never Used  Substance and Sexual Activity  . Alcohol use: No  . Drug use: No  . Sexual activity: Not on file  Lifestyle  . Physical activity:    Days per week: Not on file    Minutes per session: Not on file  . Stress: Not on file  Relationships  . Social connections:    Talks on phone: Not on file    Gets together: Not on file    Attends religious service: Not on file    Active member of club or organization: Not on file    Attends meetings of clubs or organizations: Not on file    Relationship status: Not on file  Other Topics Concern  . Not on file  Social History Narrative  . Not on file  Family History: The patient's family history includes CAD in his father; Heart disease in his father; Stroke in his father. ROS:   Please see the history of present illness.    All 14 point review of systems negative except as described per history of present illness  EKGs/Labs/Other Studies Reviewed:      Recent Labs: No results found for requested labs within last 8760 hours.  Recent Lipid Panel No results found for: CHOL, TRIG, HDL, CHOLHDL, VLDL, LDLCALC, LDLDIRECT  Physical Exam:    VS:  BP 132/78 (BP Location: Right Arm, Patient Position: Sitting, Cuff Size: Normal)   Pulse 62   Ht 5\' 11"  (1.803 m)   Wt 220 lb (99.8 kg)   SpO2 98%   BMI 30.68 kg/m     Wt Readings from Last 3 Encounters:  07/21/18 220 lb (99.8 kg)  07/14/18 222 lb 3.2 oz (100.8 kg)  06/11/18 220 lb 9.6 oz (100.1 kg)     GEN:  Well nourished, well developed in no acute distress HEENT: Normal NECK: No JVD; No carotid  bruits LYMPHATICS: No lymphadenopathy CARDIAC: RRR, no murmurs, no rubs, no gallops RESPIRATORY:  Clear to auscultation without rales, wheezing or rhonchi  ABDOMEN: Soft, non-tender, non-distended MUSCULOSKELETAL:  No edema; No deformity  SKIN: Warm and dry LOWER EXTREMITIES: no swelling NEUROLOGIC:  Alert and oriented x 3 PSYCHIATRIC:  Normal affect   ASSESSMENT:    1. Right bundle branch block   2. Sarcoid   3. Obstructive sleep apnea   4. Dyslipidemia   5. Dyspnea on exertion    PLAN:    In order of problems listed above:   1. right bundle branch block noted chronic. 2.  Sarcoidosis.  Stable followed by pulmonary. 3.  Obstructive sleep apnea followed by pulmonary. Dyslipidemia continue present management. Dyspnea on exertion most likely multifactorial check proBNP as well as Chem-7 make sure there is no cardiac involvement.   Medication Adjustments/Labs and Tests Ordered: Current medicines are reviewed at length with the patient today.  Concerns regarding medicines are outlined above.  No orders of the defined types were placed in this encounter.  Medication changes: No orders of the defined types were placed in this encounter.   Signed, Georgeanna Leaobert J. Werner Labella, MD, [facility] 07/21/2018 11:27 AM    [facility]

## 2018-07-21 NOTE — Patient Instructions (Signed)
Medication Instructions:  Your physician recommends that you continue on your current medications as directed. Please refer to the Current Medication list given to you today.  If you need a refill on your cardiac medications before your next appointment, please call your pharmacy.   Lab work: Your physician recommends that you have the following labs drawn: BMP and ProBNP today.  If you have labs (blood work) drawn today and your tests are completely normal, you will receive your results only by: [name]. MyChart Message (if you have MyChart) OR . A paper copy in the mail If you have any lab test that is abnormal or we need to change your treatment, we will call you to review the results.  Testing/Procedures: None  Follow-Up: At [facility], you and your health needs are our priority.  As part of our continuing mission to provide you with exceptional heart care, we have created designated Provider Care Teams.  These Care Teams include your primary Cardiologist (physician) and Advanced Practice Providers (APPs -  Physician Assistants and Nurse Practitioners) who all work together to provide you with the care you need, when you need it.  You will need a follow up appointment in 6 months.  Please call our office 2 months in advance to schedule this appointment.  You may see another member of our [facility] Provider Team in [place]: Norman HerrlichBrian Munley, MD . Belva Cromeajan Revankar, MD  Any Other Special Instructions Will Be Listed Below (If Applicable).

## 2019-01-14 ENCOUNTER — Other Ambulatory Visit: Payer: Self-pay

## 2019-01-14 ENCOUNTER — Telehealth: Payer: Self-pay | Admitting: Emergency Medicine

## 2019-01-14 ENCOUNTER — Encounter: Payer: Self-pay | Admitting: Cardiology

## 2019-01-14 ENCOUNTER — Telehealth (INDEPENDENT_AMBULATORY_CARE_PROVIDER_SITE_OTHER): Payer: Medicare Other | Admitting: Cardiology

## 2019-01-14 VITALS — Wt 210.0 lb

## 2019-01-14 DIAGNOSIS — G4733 Obstructive sleep apnea (adult) (pediatric): Secondary | ICD-10-CM

## 2019-01-14 DIAGNOSIS — I4510 Unspecified right bundle-branch block: Secondary | ICD-10-CM | POA: Diagnosis not present

## 2019-01-14 DIAGNOSIS — E785 Hyperlipidemia, unspecified: Secondary | ICD-10-CM

## 2019-01-14 DIAGNOSIS — D869 Sarcoidosis, unspecified: Secondary | ICD-10-CM

## 2019-01-14 NOTE — Patient Instructions (Signed)
Medication Instructions:  Your physician recommends that you continue on your current medications as directed. Please refer to the Current Medication list given to you today.  If you need a refill on your cardiac medications before your next appointment, please call your pharmacy.   Lab work: None.  If you have labs (blood work) drawn today and your tests are completely normal, you will receive your results only by: . MyChart Message (if you have MyChart) OR . A paper copy in the mail If you have any lab test that is abnormal or we need to change your treatment, we will call you to review the results.  Testing/Procedures: Your physician has requested that you have an echocardiogram. Echocardiography is a painless test that uses sound waves to create images of your heart. It provides your doctor with information about the size and shape of your heart and how well your heart's chambers and valves are working. This procedure takes approximately one hour. There are no restrictions for this procedure.    Follow-Up: At [facility], you and your health needs are our priority.  As part of our continuing mission to provide you with exceptional heart care, we have created designated Provider Care Teams.  These Care Teams include your primary Cardiologist (physician) and Advanced Practice Providers (APPs -  Physician Assistants and Nurse Practitioners) who all work together to provide you with the care you need, when you need it. You will need a follow up appointment in 3 months.  Please call our office 2 months in advance to schedule this appointment.  You may see No primary care provider on file. or another member of our [facility] Provider Team in [place]: Brian Munley, MD . Rajan Revankar, MD  Any Other Special Instructions Will Be Listed Below (If Applicable).   Echocardiogram An echocardiogram is a procedure that uses painless sound waves (ultrasound) to produce an image of the heart.  Images from an echocardiogram can provide important information about:  Signs of coronary artery disease (CAD).  Aneurysm detection. An aneurysm is a weak or damaged part of an artery wall that bulges out from the normal force of blood pumping through the body.  Heart size and shape. Changes in the size or shape of the heart can be associated with certain conditions, including heart failure, aneurysm, and CAD.  Heart muscle function.  Heart valve function.  Signs of a past heart attack.  Fluid buildup around the heart.  Thickening of the heart muscle.  A tumor or infectious growth around the heart valves. Tell a health care provider about:  Any allergies you have.  All medicines you are taking, including vitamins, herbs, eye drops, creams, and over-the-counter medicines.  Any blood disorders you have.  Any surgeries you have had.  Any medical conditions you have.  Whether you are pregnant or may be pregnant. What are the risks? Generally, this is a safe procedure. However, problems may occur, including:  Allergic reaction to dye (contrast) that may be used during the procedure. What happens before the procedure? No specific preparation is needed. You may eat and drink normally. What happens during the procedure?   An IV tube may be inserted into one of your veins.  You may receive contrast through this tube. A contrast is an injection that improves the quality of the pictures from your heart.  A gel will be applied to your chest.  A wand-like tool (transducer) will be moved over your chest. The gel will help to   transmit the sound waves from the transducer.  The sound waves will harmlessly bounce off of your heart to allow the heart images to be captured in real-time motion. The images will be recorded on a computer. The procedure may vary among health care providers and hospitals. What happens after the procedure?  You may return to your normal, everyday life,  including diet, activities, and medicines, unless your health care provider tells you not to do that. Summary  An echocardiogram is a procedure that uses painless sound waves (ultrasound) to produce an image of the heart.  Images from an echocardiogram can provide important information about the size and shape of your heart, heart muscle function, heart valve function, and fluid buildup around your heart.  You do not need to do anything to prepare before this procedure. You may eat and drink normally.  After the echocardiogram is completed, you may return to your normal, everyday life, unless your health care provider tells you not to do that. This information is not intended to replace advice given to you by your health care provider. Make sure you discuss any questions you have with your health care provider. Document Released: 07/20/2000 Document Revised: 08/25/2016 Document Reviewed: 08/25/2016 Elsevier Interactive Patient Education  2019 Elsevier Inc.    

## 2019-01-14 NOTE — Progress Notes (Signed)
Virtual Visit via Video Note   This visit type was conducted due to national recommendations for restrictions regarding the COVID-19 Pandemic (e.g. social distancing) in an effort to limit this patient's exposure and mitigate transmission in our community.  Due to his co-morbid illnesses, this patient is at least at moderate risk for complications without adequate follow up.  This format is felt to be most appropriate for this patient at this time.  All issues noted in this document were discussed and addressed.  A limited physical exam was performed with this format.  Please refer to the patient's chart for his consent to telehealth for [facility].  Evaluation Performed:  Follow-up visit  This visit type was conducted due to national recommendations for restrictions regarding the COVID-19 Pandemic (e.g. social distancing).  This format is felt to be most appropriate for this patient at this time.  All issues noted in this document were discussed and addressed.  No physical exam was performed (except for noted visual exam findings with Video Visits).  Please refer to the patient's chart (MyChart message for video visits and phone note for telephone visits) for the patient's consent to telehealth for [facility].  Date:  01/14/2019  ID: Alfred Stephenson, DOB [birth year], MRN [id number]   Patient Location: [address] [place] [address]   Provider location:   [place] Office  PCP:  Leanna Battles, MD  Cardiologist:  Jenne Campus, MD     Chief Complaint: Doing well  History of Present Illness:    Alfred Stephenson is a 68 y.o. male  who presents via audio/video conferencing for a telehealth visit today.  With sarcoidosis, obstructive sleep apnea, hypertension, dyslipidemia.  Have a video visit with me today overall doing well described to have some exertional shortness of breath.  Struggling with staying at home because of coronavirus situation but overall seems  to be coping with the situation recently.  Denies having a chest pain tightness squeezing pressure burning chest shortness of breath exertional   The patient does not have symptoms concerning for COVID-19 infection (fever, chills, cough, or new SHORTNESS OF BREATH).    Prior CV studies:   The following studies were reviewed today:  Stress test reviewed from last year which was negative.     Past Medical History:  Diagnosis Date  . Anxiety   . Arthritis    arthritis left index finger   . Chronic kidney disease   . Hyperlipidemia   . Hypertension   . Sarcoidosis   . Sleep apnea    cpap does not know settings, 02/04/18 BiPAP    Past Surgical History:  Procedure Laterality Date  . BACK SURGERY  2007  . CARDIAC CATHETERIZATION    . GREEN LIGHT LASER TURP (TRANSURETHRAL RESECTION OF PROSTATE  08/22/2012   Procedure: GREEN LIGHT LASER TURP (TRANSURETHRAL RESECTION OF PROSTATE;  Surgeon: Fredricka Bonine, MD;  Location: [facility];  Service: Urology;  Laterality: N/A;      . LUNG BIOPSY  1970  . NASAL SINUS SURGERY  2006  . NECK SURGERY  2007     Current Meds  Medication Sig  . amLODipine (NORVASC) 10 MG tablet Take 10 mg by mouth daily.   [name] aspirin EC 81 MG tablet Take 81 mg by mouth daily.  [name] oxybutynin (DITROPAN XL) 15 MG 24 hr tablet Take 15 mg by mouth at bedtime.  . pravastatin (PRAVACHOL) 40 MG tablet Take 40 mg by mouth daily.  [name]  PRISTIQ 100 MG 24 hr tablet Take 100 mg by mouth every morning.   [name]. telmisartan (MICARDIS) 80 MG tablet Take 80 mg by mouth daily.       Family History: The patient's family history includes CAD in his father; Heart disease in his father; Stroke in his father.   ROS:   Please see the history of present illness.     All other systems reviewed and are negative.   Labs/Other Tests and Data Reviewed:     Recent Labs: 07/21/2018: BUN 21; Creatinine, Ser 1.58; NT-Pro BNP 75; Potassium 5.1; Sodium 137  Recent Lipid Panel No  results found for: CHOL, TRIG, HDL, CHOLHDL, VLDL, LDLCALC, LDLDIRECT    Exam:    Vital Signs:  Wt 210 lb (95.3 kg)   BMI 29.29 kg/m     Wt Readings from Last 3 Encounters:  01/14/19 210 lb (95.3 kg)  07/21/18 220 lb (99.8 kg)  07/14/18 222 lb 3.2 oz (100.8 kg)     Well nourished, well developed in no acute distress. Alert awake and attentive 3 happy to be able to talk to me over the video link not in any distress  Diagnosis for this visit:   1. Right bundle branch block   2. Obstructive sleep apnea   3. Sarcoid   4. Dyslipidemia      ASSESSMENT & PLAN:    1.  Right bundle branch block noted.  I will ask him to have an echocardiogram done to assess right ventricle size and function. 2.  Obstructive sleep apnea doing well from that point review use CPAP mask of the regular basis and happy with it. 3.  Sarcoidosis stable followed by anti-medicine team. 4.  Dyslipidemia apparently last fasting lipid profile was good.  We will continue monitoring  COVID-19 Education: The signs and symptoms of COVID-19 were discussed with the patient and how to seek care for testing (follow up with PCP or arrange E-visit).  The importance of social distancing was discussed today.  Patient Risk:   After full review of this patients clinical status, I feel that they are at least moderate risk at this time.  Time:   Today, I have spent 15 minutes with the patient with telehealth technology discussing pt health issues.  I spent 5 minutes reviewing her chart before the visit.  Visit was finished at 11:02 AM.    Medication Adjustments/Labs and Tests Ordered: Current medicines are reviewed at length with the patient today.  Concerns regarding medicines are outlined above.  No orders of the defined types were placed in this encounter.  Medication changes: No orders of the defined types were placed in this encounter.    Disposition: Follow-up 6 months, echocardiogram in 3 months  Signed,  Georgeanna Leaobert J. Keeshawn Fakhouri, MD, [facility] 01/14/2019 11:03 AM    [facility]

## 2019-01-14 NOTE — Telephone Encounter (Signed)
Left message for patient to return call regarding follow up appointment and echocardiogram appointment.

## 2019-01-14 NOTE — Addendum Note (Signed)
Addended by: Ashok Norris on: 01/14/2019 11:33 AM   Modules accepted: Orders

## 2019-02-18 DIAGNOSIS — R159 Full incontinence of feces: Secondary | ICD-10-CM | POA: Insufficient documentation

## 2019-03-26 ENCOUNTER — Other Ambulatory Visit: Payer: Self-pay

## 2019-03-26 DIAGNOSIS — Z20822 Contact with and (suspected) exposure to covid-19: Secondary | ICD-10-CM

## 2019-03-27 LAB — NOVEL CORONAVIRUS, NAA: SARS-CoV-2, NAA: NOT DETECTED

## 2019-04-16 ENCOUNTER — Other Ambulatory Visit: Payer: Medicare Other

## 2019-05-19 ENCOUNTER — Other Ambulatory Visit: Payer: Self-pay

## 2019-05-19 ENCOUNTER — Ambulatory Visit (INDEPENDENT_AMBULATORY_CARE_PROVIDER_SITE_OTHER): Payer: Medicare Other

## 2019-05-19 DIAGNOSIS — I4510 Unspecified right bundle-branch block: Secondary | ICD-10-CM | POA: Diagnosis not present

## 2019-05-19 NOTE — Progress Notes (Signed)
Complete echocardiogram has been performed.  Jimmy Ivan Lacher RDCS, RVT 

## 2019-06-15 ENCOUNTER — Ambulatory Visit: Payer: Medicare Other | Admitting: Neurology

## 2019-06-15 ENCOUNTER — Telehealth: Payer: Self-pay | Admitting: Neurology

## 2019-06-15 NOTE — Telephone Encounter (Signed)
Patient wife called thinking the visit would be a VV. Attempted to offer a VV option to one of the NP's but they were unable to accept. The patient was never told this visit would be a VV. Was transferred to billing to discuss no show fee per Jael in phone room

## 2019-06-16 ENCOUNTER — Encounter: Payer: Self-pay | Admitting: Neurology

## 2019-07-17 IMAGING — CT CT CHEST HIGH RESOLUTION W/O CM
3 of 10 series · 14 of 36 positions shown, 17 images · non-contrast
Comparison: 11/19/2017 chest radiograph.  07/06/2011 chest CT.

CLINICAL DATA: Sarcoidosis, follow-up.

EXAM:
CT CHEST WITHOUT CONTRAST
TECHNIQUE: Multidetector CT imaging of the chest was performed following the
standard protocol without intravenous contrast. High resolution
imaging of the lungs, as well as inspiratory and expiratory imaging,
was performed.

[Series 7: coronal · coronal · 0.54mm/px · 1 of 132 slices shown]
[im 66/132  lung]
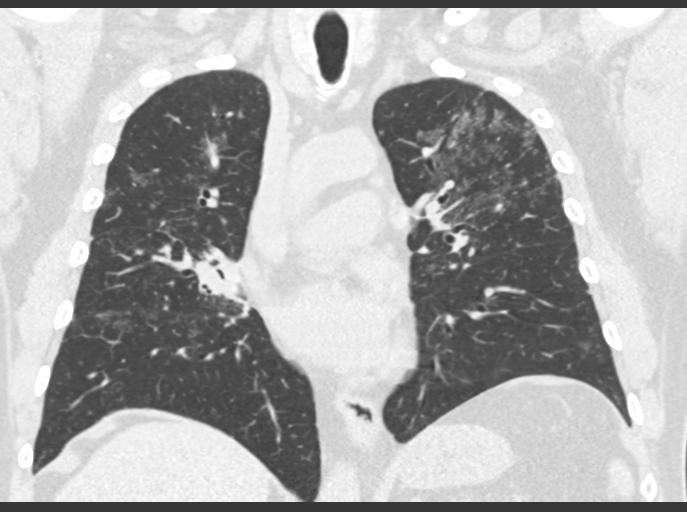

[Series 12: high resolution 1.0 i30f 2 · axial · 0.74mm/px · z∈[-455,-432]mm · 2 of 92 slices shown (1 of 2)]
[im 23/92  lung]
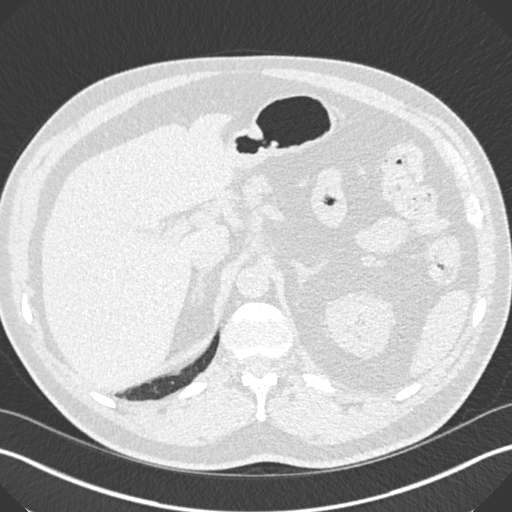
[im 46/92  lung]
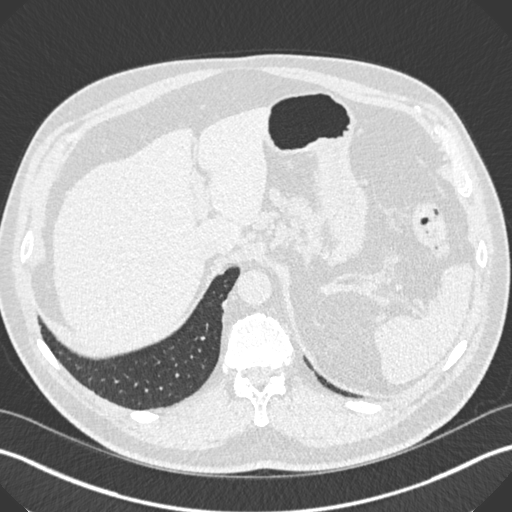

[Series 18: high resolution 1.0 i30f 2 · axial · 0.74mm/px · z∈[-412,-189]mm · 11 of 269 slices shown, 14 images (2 of 2)]
[im 23/269  mediastinal]
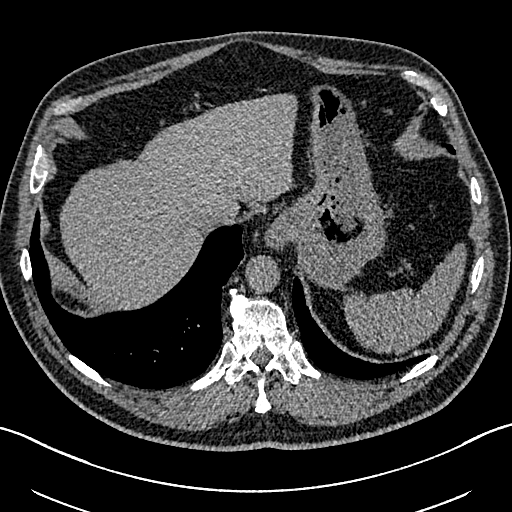
[im 23/269  lung]
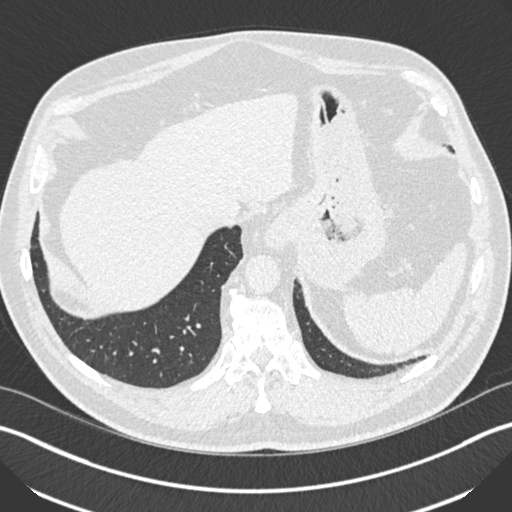
[im 45/269  lung]
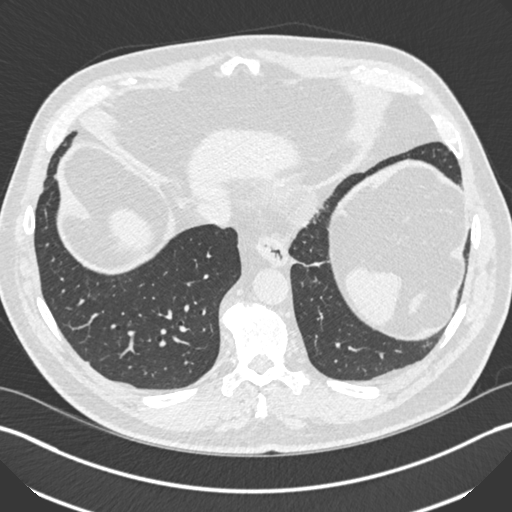
[im 68/269  lung]
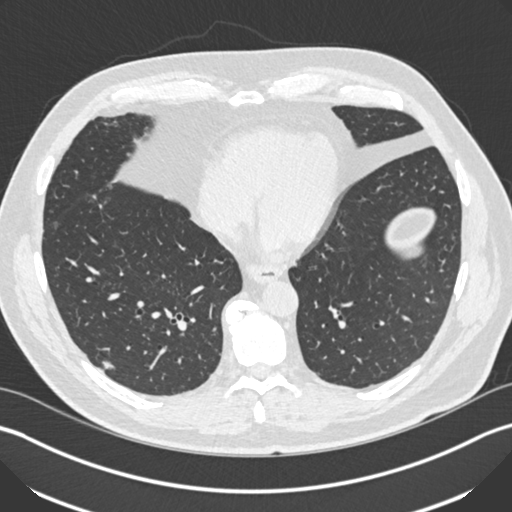
[im 90/269  lung]
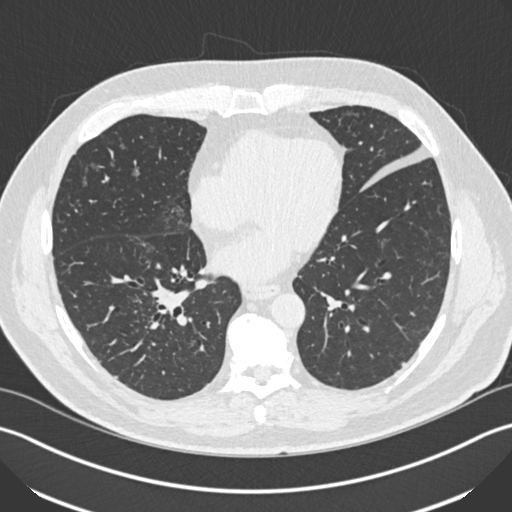
[im 112/269  mediastinal]
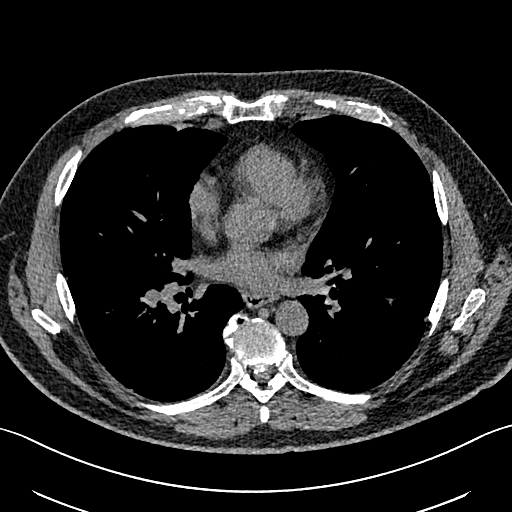
[im 112/269  lung]
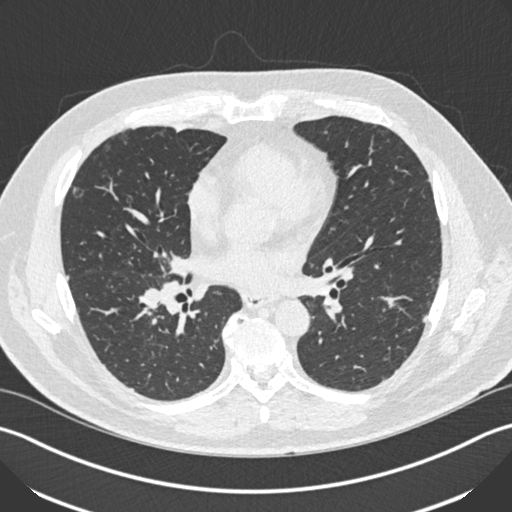
[im 135/269  lung]
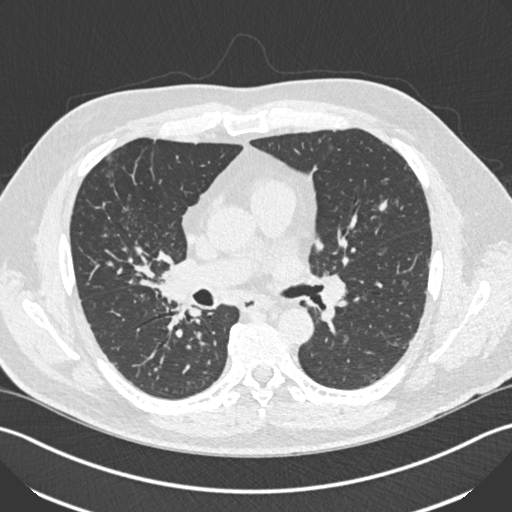
[im 157/269  lung]
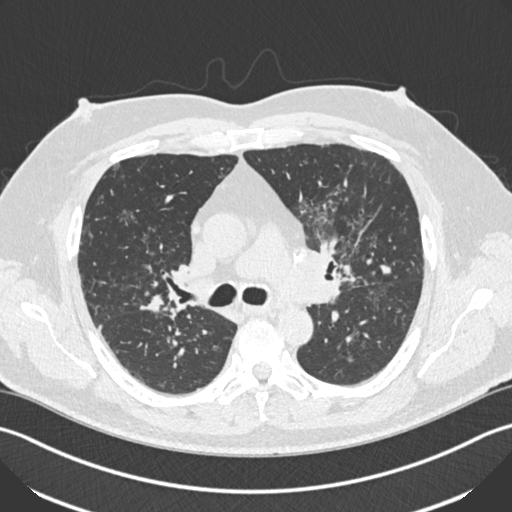
[im 179/269  lung]
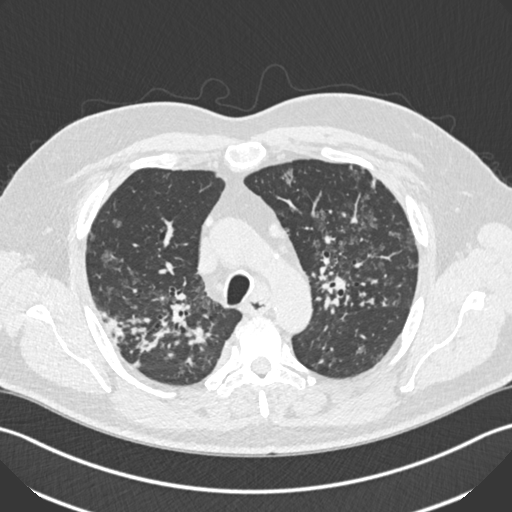
[im 202/269  mediastinal]
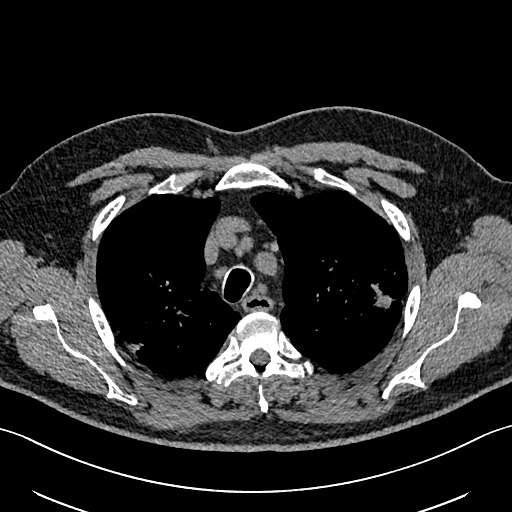
[im 202/269  lung]
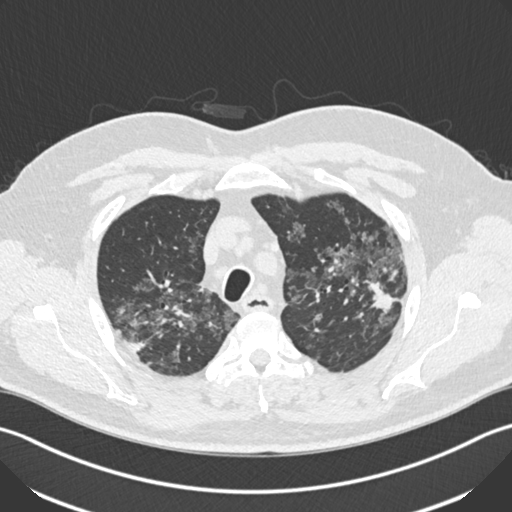
[im 224/269  lung]
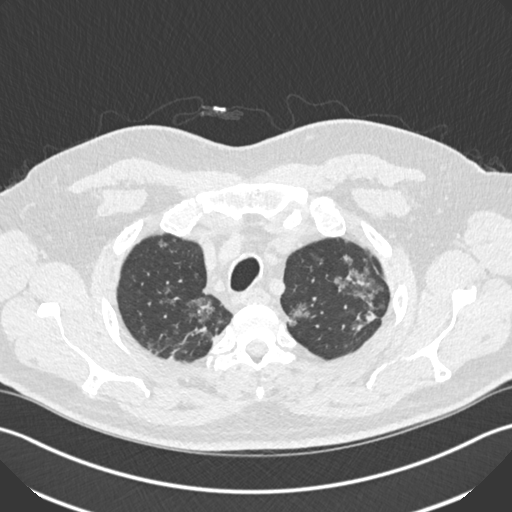
[im 246/269  lung]
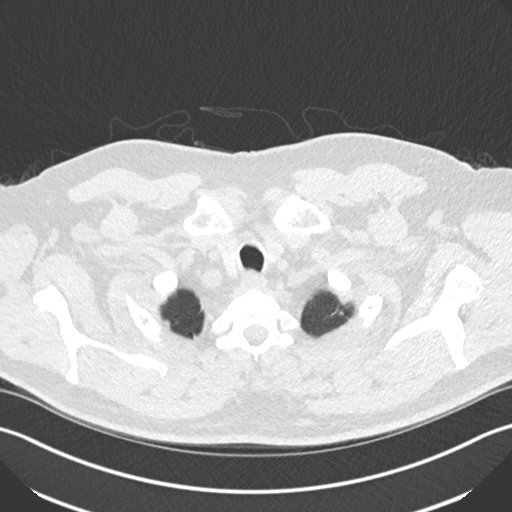

[14 of 36 positions shown; findings below may reference images not displayed]

FINDINGS: Cardiovascular: Normal heart size. No significant pericardial
effusion/thickening. Left anterior descending and left circumflex
coronary atherosclerosis. Atherosclerotic nonaneurysmal thoracic
aorta. Normal caliber pulmonary arteries.

Mediastinum/Nodes: No discrete thyroid nodules. Unremarkable
esophagus. No axillary adenopathy. Coarsely calcified AP window node
is stable. Mildly enlarged non calcified bilateral paratracheal and
subcarinal lymph nodes are stable. Representative 1.0 cm right
paratracheal node (series 16/image 25), 1.2 cm left paratracheal
node (series 16/image 46) and 1.0 cm subcarinal node (series
16/image 60), all stable since 07/06/2011 chest CT. Mild bilateral
hilar adenopathy is poorly delineated on these noncontrast images
and appears stable.

Lungs/Pleura: No pneumothorax. No pleural effusion. There is patchy
prominent nodular thickening of the central and peripheral
peribronchovascular interstitium in both lungs, predominantly
involving the upper lobes with relative sparing of the lung bases.
There is associated mild architectural distortion and volume loss in
the upper lungs. There is additional patchy finely nodular
ground-glass opacity throughout the bilateral upper lungs, also
involving the central and peripheral peribronchovascular regions.
There is scattered fine nodularity along fissures and peripheral
pleural surfaces bilaterally. Mild traction bronchiectasis is
present in the posterior right upper lobe. There is mild progression
of these findings since 07/06/2011 chest CT. For example clustered
nodularity in the posterior right upper lung measures 2.6 x 1.5 cm
(series 17/image 38), increased from 1.9 x 1.2 cm. Irregular
nodularity in left upper lobe measuring 2.1 x 1.2 cm (series
17/image 27), increased from 1.6 x 1.1 cm. Posterior right lower
lobe irregular 1.2 cm nodule (series 17/image 85), minimally
increased from 1.1 cm. No frank honeycombing. Mild to moderate
patchy air trapping in both lungs on the expiration sequence.

Upper abdomen: Small hiatal hernia.

Musculoskeletal: No aggressive appearing focal osseous lesions.
Moderate thoracic spondylosis. Partially visualized surgical
hardware from ACDF in the lower cervical spine.
IMPRESSION: 1. Upper lung predominant pulmonary parenchymal findings compatible
with sarcoidosis including mild upper lobe fibrosis, mildly
progressed since 7937 chest CT.
2. Mild-to-moderate patchy air trapping in both lungs, indicative of
small airways disease, also attributable pulmonary sarcoidosis.
3. Mild mediastinal and bilateral hilar adenopathy, stable since
7937, compatible with sarcoidosis.
4. Two-vessel coronary atherosclerosis.
5. Small hiatal hernia.

Aortic Atherosclerosis ([id number]).

## 2019-08-06 ENCOUNTER — Telehealth: Payer: Self-pay | Admitting: Unknown Physician Specialty

## 2019-08-06 ENCOUNTER — Other Ambulatory Visit: Payer: Self-pay | Admitting: Unknown Physician Specialty

## 2019-08-06 DIAGNOSIS — U071 COVID-19: Secondary | ICD-10-CM

## 2019-08-06 NOTE — Telephone Encounter (Signed)
  I connected by phone with Alfred Stephenson on 08/06/2019 at 11:50 AM to discuss the potential use of an new treatment for mild to moderate COVID-19 viral infection in non-hospitalized patients.  This patient is a 68 y.o. male that meets the FDA criteria for Emergency Use Authorization of bamlanivimab or casirivimab\imdevimab.  Has a (+) direct SARS-CoV-2 viral test result  Has mild or moderate COVID-19   Is ? 70 years of age and weighs ? 40 kg  Is NOT hospitalized due to COVID-19  Is NOT requiring oxygen therapy or requiring an increase in baseline oxygen flow rate due to COVID-19  Is within 10 days of symptom onset  Has at least one of the high risk factor(s) for progression to severe COVID-19 and/or hospitalization as defined in EUA.  Specific high risk criteria : >/= 70 yo and obesity   I have spoken and communicated the following to the patient or parent/caregiver:  1. FDA has authorized the emergency use of bamlanivimab and casirivimab\imdevimab for the treatment of mild to moderate COVID-19 in adults and pediatric patients with positive results of direct SARS-CoV-2 viral testing who are 64 years of age and older weighing at least 40 kg, and who are at high risk for progressing to severe COVID-19 and/or hospitalization.  2. The significant known and potential risks and benefits of bamlanivimab and casirivimab\imdevimab, and the extent to which such potential risks and benefits are unknown.  3. Information on available alternative treatments and the risks and benefits of those alternatives, including clinical trials.  4. Patients treated with bamlanivimab and casirivimab\imdevimab should continue to self-isolate and use infection control measures (e.g., wear mask, isolate, social distance, avoid sharing personal items, clean and disinfect "high touch" surfaces, and frequent handwashing) according to CDC guidelines.   5. The patient or parent/caregiver has the option to accept or  refuse bamlanivimab or casirivimab\imdevimab .  After reviewing this information with the patient, The patient agreed to proceed with receiving the bamlanimivab infusion and will be provided a copy of the Fact sheet prior to receiving the infusion.Alfred Stephenson 08/06/2019 11:50 AM

## 2019-08-10 ENCOUNTER — Ambulatory Visit (HOSPITAL_COMMUNITY)
Admission: RE | Admit: 2019-08-10 | Discharge: 2019-08-10 | Disposition: A | Discharge status: Home / Self Care | Payer: Medicare Other | Source: Ambulatory Visit | Attending: Pulmonary Disease | Admitting: Pulmonary Disease

## 2019-08-10 ENCOUNTER — Other Ambulatory Visit: Payer: Self-pay | Admitting: Unknown Physician Specialty

## 2019-08-10 DIAGNOSIS — U071 COVID-19: Secondary | ICD-10-CM | POA: Insufficient documentation

## 2019-08-10 DIAGNOSIS — Z23 Encounter for immunization: Secondary | ICD-10-CM | POA: Diagnosis not present

## 2019-08-10 MED ORDER — METHYLPREDNISOLONE SODIUM SUCC 125 MG IJ SOLR: 125.0000 mg | Freq: Once | INTRAMUSCULAR | Status: DC | PRN

## 2019-08-10 MED ORDER — DIPHENHYDRAMINE HCL 50 MG/ML IJ SOLN: 50.0000 mg | Freq: Once | INTRAMUSCULAR | Status: DC | PRN

## 2019-08-10 MED ORDER — FAMOTIDINE IN NACL 20-0.9 MG/50ML-% IV SOLN: 20.0000 mg | Freq: Once | INTRAVENOUS | Status: DC | PRN

## 2019-08-10 MED ORDER — EPINEPHRINE 0.3 MG/0.3ML IJ SOAJ: 0.3000 mg | Freq: Once | INTRAMUSCULAR | Status: DC | PRN

## 2019-08-10 MED ORDER — ALBUTEROL SULFATE HFA 108 (90 BASE) MCG/ACT IN AERS: 2.0000 | INHALATION_SPRAY | Freq: Once | RESPIRATORY_TRACT | Status: DC | PRN

## 2019-08-10 MED ORDER — SODIUM CHLORIDE 0.9 % IV SOLN
700.0000 mg | Freq: Once | INTRAVENOUS | Status: AC
  Administered 2019-08-10: 700 mg via INTRAVENOUS
  Filled 2019-08-10: qty 20

## 2019-08-10 MED ORDER — SODIUM CHLORIDE 0.9 % IV SOLN
INTRAVENOUS | Status: DC | PRN
  Administered 2019-08-10: 250 mL via INTRAVENOUS

## 2019-08-10 NOTE — Progress Notes (Signed)
  Diagnosis: COVID-19  Physician: Jarome Matin, MD  Procedure: [facility] Med: bamlanivimab infusion - Provided patient with bamlanimivab fact sheet for patients, parents and caregivers prior to infusion.  Complications: No immediate complications noted.  Discharge: Discharged home   Cherlynn Perches 08/10/2019

## 2019-08-10 NOTE — Discharge Instructions (Signed)

## 2019-08-12 ENCOUNTER — Encounter (HOSPITAL_COMMUNITY): Payer: Self-pay

## 2019-08-12 ENCOUNTER — Emergency Department (HOSPITAL_COMMUNITY): Payer: Medicare Other

## 2019-08-12 ENCOUNTER — Other Ambulatory Visit: Payer: Self-pay

## 2019-08-12 ENCOUNTER — Emergency Department (HOSPITAL_COMMUNITY)
Admission: EM | Admit: 2019-08-12 | Discharge: 2019-08-13 | Disposition: A | Discharge status: Home / Self Care | Payer: Medicare Other | Attending: Emergency Medicine | Admitting: Emergency Medicine

## 2019-08-12 DIAGNOSIS — Z79899 Other long term (current) drug therapy: Secondary | ICD-10-CM | POA: Insufficient documentation

## 2019-08-12 DIAGNOSIS — I129 Hypertensive chronic kidney disease with stage 1 through stage 4 chronic kidney disease, or unspecified chronic kidney disease: Secondary | ICD-10-CM | POA: Diagnosis not present

## 2019-08-12 DIAGNOSIS — U071 COVID-19: Secondary | ICD-10-CM | POA: Insufficient documentation

## 2019-08-12 DIAGNOSIS — Z7982 Long term (current) use of aspirin: Secondary | ICD-10-CM | POA: Insufficient documentation

## 2019-08-12 DIAGNOSIS — N189 Chronic kidney disease, unspecified: Secondary | ICD-10-CM | POA: Insufficient documentation

## 2019-08-12 DIAGNOSIS — R42 Dizziness and giddiness: Secondary | ICD-10-CM

## 2019-08-12 LAB — COMPREHENSIVE METABOLIC PANEL
ALT: 15 U/L (ref 0–44)
AST: 21 U/L (ref 15–41)
Albumin: 4.1 g/dL (ref 3.5–5.0)
Alkaline Phosphatase: 95 U/L (ref 38–126)
Anion gap: 11 (ref 5–15)
BUN: 33 mg/dL — ABNORMAL HIGH (ref 8–23)
CO2: 22 mmol/L (ref 22–32)
Calcium: 9 mg/dL (ref 8.9–10.3)
Chloride: 103 mmol/L (ref 98–111)
Creatinine, Ser: 1.66 mg/dL — ABNORMAL HIGH (ref 0.61–1.24)
GFR calc Af Amer: 48 mL/min — ABNORMAL LOW (ref >60)
GFR calc non Af Amer: 41 mL/min — ABNORMAL LOW (ref >60)
Glucose, Bld: 95 mg/dL (ref 70–99)
Potassium: 4.8 mmol/L (ref 3.5–5.1)
Sodium: 136 mmol/L (ref 135–145)
Total Bilirubin: 0.3 mg/dL (ref 0.3–1.2)
Total Protein: 7.6 g/dL (ref 6.5–8.1)

## 2019-08-12 LAB — CBC WITH DIFFERENTIAL/PLATELET
Abs Immature Granulocytes: 0.03 10*3/uL (ref 0.00–0.07)
Basophils Absolute: 0 10*3/uL (ref 0.0–0.1)
Basophils Relative: 0 %
Eosinophils Absolute: 0 10*3/uL (ref 0.0–0.5)
Eosinophils Relative: 0 %
HCT: 48 % (ref 39.0–52.0)
Hemoglobin: 15.9 g/dL (ref 13.0–17.0)
Immature Granulocytes: 0 %
Lymphocytes Relative: 15 %
Lymphs Abs: 1 10*3/uL (ref 0.7–4.0)
MCH: 30.8 pg (ref 26.0–34.0)
MCHC: 33.1 g/dL (ref 30.0–36.0)
MCV: 93 fL (ref 80.0–100.0)
Monocytes Absolute: 0.8 10*3/uL (ref 0.1–1.0)
Monocytes Relative: 12 %
Neutro Abs: 5 10*3/uL (ref 1.7–7.7)
Neutrophils Relative %: 73 %
Platelets: 234 10*3/uL (ref 150–400)
RBC: 5.16 MIL/uL (ref 4.22–5.81)
RDW: 13 % (ref 11.5–15.5)
WBC: 6.9 10*3/uL (ref 4.0–10.5)
nRBC: 0 % (ref 0.0–0.2)

## 2019-08-12 LAB — TROPONIN I (HIGH SENSITIVITY): Troponin I (High Sensitivity): 3 ng/L (ref <18)

## 2019-08-12 MED ORDER — SODIUM CHLORIDE 0.9 % IV BOLUS
1000.0000 mL | Freq: Once | INTRAVENOUS | Status: AC
  Administered 2019-08-12: 1000 mL via INTRAVENOUS

## 2019-08-12 MED ORDER — SODIUM CHLORIDE 0.9 % IV BOLUS: 500.0000 mL | Freq: Once | INTRAVENOUS | Status: DC

## 2019-08-12 NOTE — ED Triage Notes (Signed)
Patient states that he was tested positive for Covid on 08/01/19. Patient called the on call physician because his O2 sats were 81-90 and 94, BP-76/46, 84/60 and 89/49 at home.   In triage 111/77 and Sats 100% on room air.

## 2019-08-12 NOTE — ED Provider Notes (Signed)
[facility]-EMERGENCY DEPT Provider Note   CSN: [id number] Arrival date & time: 08/12/19  1827     History Chief Complaint  Patient presents with  . Covid +    Alfred Stephenson is a 69 y.o. male.  The history is provided by the patient and medical records. No language interpreter was used.  Illness Location:  Lightheadedness with low blood pressure Severity:  Moderate Timing:  Sporadic Progression:  Waxing and waning Chronicity:  New Associated symptoms: shortness of breath   Associated symptoms: no abdominal pain, no chest pain, no congestion, no cough, no diarrhea, no ear pain, no fatigue, no fever, no headaches, no loss of consciousness, no nausea, no rhinorrhea, no vomiting and no wheezing        Past Medical History:  Diagnosis Date  . Anxiety   . Arthritis    arthritis left index finger   . Chronic kidney disease   . Hyperlipidemia   . Hypertension   . Sarcoidosis   . Sleep apnea    cpap does not know settings, 02/04/18 BiPAP    Patient Active Problem List   Diagnosis Date Noted  . History of adenomatous polyp of colon 07/15/2018  . Obstructive sleep apnea treated with BiPAP 02/04/2018  . Right bundle branch block 06/10/2017  . Dyspnea on exertion 06/10/2017  . Dyslipidemia 06/10/2017  . Dizziness 06/10/2017  . 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 Multiple lung nodules 04/18/2011  . Sarcoid 04/17/2011  . Obstructive sleep apnea 04/17/2011    Past Surgical History:  Procedure Laterality Date  . BACK SURGERY  2007  . CARDIAC CATHETERIZATION    . GREEN LIGHT LASER TURP (TRANSURETHRAL RESECTION OF PROSTATE  08/22/2012   Procedure: GREEN LIGHT LASER TURP (TRANSURETHRAL RESECTION OF PROSTATE;  Surgeon: Antony Haste, MD;  Location: [facility] ORS;  Service: Urology;  Laterality: N/A;      . LUNG BIOPSY  1970  . NASAL SINUS SURGERY  2006  . NECK SURGERY  2007       Family History  Problem Relation Age of Onset  . Heart  disease Father   . Stroke Father   . CAD Father     Social History   Tobacco Use  . Smoking status: Never Smoker  . Smokeless tobacco: Never Used  Substance Use Topics  . Alcohol use: No  . Drug use: No    Home Medications Prior to Admission medications   Medication Sig Start Date End Date Taking? Authorizing Provider  amLODipine (NORVASC) 10 MG tablet Take 10 mg by mouth daily.     [provider]  aspirin EC 81 MG tablet Take 81 mg by mouth daily.    [provider]  oxybutynin (DITROPAN XL) 15 MG 24 hr tablet Take 15 mg by mouth at bedtime.    [provider]  pravastatin (PRAVACHOL) 40 MG tablet Take 40 mg by mouth daily.    [provider]  PRISTIQ 100 MG 24 hr tablet Take 100 mg by mouth every morning.  04/05/11   [provider]  telmisartan (MICARDIS) 80 MG tablet Take 80 mg by mouth daily.  06/29/18   [provider]    Allergies    Patient has no known allergies.  Review of Systems   Review of Systems  Constitutional: Negative for chills, diaphoresis, fatigue and fever.  HENT: Negative for congestion, ear pain and rhinorrhea.   Respiratory: Positive for shortness of breath. Negative for cough, chest tightness and wheezing.  Cardiovascular: Negative for chest pain, palpitations and leg swelling.  Gastrointestinal: Negative for abdominal pain, constipation, diarrhea, nausea and vomiting.  Genitourinary: Negative for flank pain.  Musculoskeletal: Negative for back pain, neck pain and neck stiffness.  Neurological: Positive for light-headedness. Negative for loss of consciousness, syncope and headaches.  All other systems reviewed and are negative.   Physical Exam Updated Vital Signs BP 111/77   Pulse 96   Temp 98.1 F (36.7 C) (Oral)   Ht 5\' 11"  (1.803 m)   Wt 91.6 kg   SpO2 100%   BMI 28.17 kg/m   Physical Exam Vitals and nursing note reviewed.  Constitutional:      General: He is not in acute  distress.    Appearance: He is well-developed. He is not ill-appearing, toxic-appearing or diaphoretic.  HENT:     Head: Normocephalic and atraumatic.     Nose: Nose normal. No congestion or rhinorrhea.     Mouth/Throat:     Mouth: Mucous membranes are dry.     Pharynx: No oropharyngeal exudate or posterior oropharyngeal erythema.  Eyes:     Conjunctiva/sclera: Conjunctivae normal.     Pupils: Pupils are equal, round, and reactive to light.  Cardiovascular:     Rate and Rhythm: Normal rate and regular rhythm.     Pulses: Normal pulses.     Heart sounds: No murmur.  Pulmonary:     Effort: Pulmonary effort is normal. No respiratory distress.     Breath sounds: Normal breath sounds. No wheezing, rhonchi or rales.  Chest:     Chest wall: No tenderness.  Abdominal:     Palpations: Abdomen is soft.     Tenderness: There is no abdominal tenderness. There is no right CVA tenderness or left CVA tenderness.  Musculoskeletal:        General: No tenderness.     Cervical back: Neck supple.     Right lower leg: No edema.     Left lower leg: No edema.  Skin:    General: Skin is warm and dry.     Capillary Refill: Capillary refill takes less than 2 seconds.     Findings: No erythema, lesion or rash.  Neurological:     General: No focal deficit present.     Mental Status: He is alert.     Gait: Gait normal.  Psychiatric:        Mood and Affect: Mood normal.     ED Results / Procedures / Treatments   Labs (all labs ordered are listed, but only abnormal results are displayed) Labs Reviewed  COMPREHENSIVE METABOLIC PANEL - Abnormal; Notable for the following components:      Result Value   BUN 33 (*)    Creatinine, Ser 1.66 (*)    GFR calc non Af Amer 41 (*)    GFR calc Af Amer 48 (*)    All other components within normal limits  CBC WITH DIFFERENTIAL/PLATELET  TROPONIN I (HIGH SENSITIVITY)  TROPONIN I (HIGH SENSITIVITY)    EKG EKG Interpretation  Date/Time:  Wednesday August 12 2019 20:55:42 EST Ventricular Rate:  80 PR Interval:    QRS Duration: 120 QT Interval:  378 QTC Calculation: 436 R Axis:   168 Text Interpretation: Sinus rhythm RBBB and LPFB Baseline wander in lead(s) V4 when compared to prior, slightly more irregular. No STEMI Confirmed by 04-17-2006 (Theda Belfast) on 08/12/2019 11:32:06 PM   Radiology DG Chest Portable 1 View  Result Date: 08/12/2019 CLINICAL DATA:  Hypoxia. EXAM: PORTABLE CHEST 1 VIEW COMPARISON:  November 19, 2017 FINDINGS: Very mild, stable hazy atelectasis and/or focal scarring is seen along the periphery of the left apex. There is no evidence of acute infiltrate, pleural effusion or pneumothorax. The heart size and mediastinal contours are within normal limits. A radiopaque fusion plate and screws are seen overlying the lower cervical spine. Degenerative changes seen within the thoracic spine. IMPRESSION: 1. No acute or active cardiopulmonary disease. Electronically Signed   By: Virgina Norfolk M.D.   On: 08/12/2019 21:19    Procedures Procedures (including critical care time)  Medications Ordered in ED Medications  sodium chloride 0.9 % bolus 1,000 mL (0 mLs Intravenous Stopped 08/12/19 2232)    ED Course  I have reviewed the triage vital signs and the nursing notes.  Pertinent labs & imaging results that were available during my care of the patient were reviewed by me and considered in my medical decision making (see chart for details).    MDM Rules/Calculators/A&P                      Alfred Stephenson is a 69 y.o. male with a past medical history significant for sarcoidosis, dyslipidemia, hypertension, CKD, sleep apnea and COVID-19 infection who presents with an episode of lightheadedness and shortness of breath with concerning vital signs at home.  According to patient, last night and earlier today he had episodes where he was feeling lightheaded and short of breath.  Patient reports that his wife has low blood pressure problems  and they have the pulse oximetry and blood pressure cuff which was able to assess him.  He arrived with a piece of paper showing that his oxygen saturations were 81% earlier today on room air and he does not take oxygen at home.  He also was found to have blood pressures of 76 systolic at 1 point when he was lightheaded.  He denies chest pain, palpitations, nausea, vomiting, urinary symptoms or constipation.  He does report some mild diarrhea.  He reports that his cough is improved and he had only very mild shortness of breath earlier when his oxygen was low.  He otherwise reports his symptoms have been improving and he feels "great" today.  On exam, lungs are clear and chest is nontender.  Abdomen is nontender.  No focal neurologic deficits.  No significant lower extremity tenderness or edema.  Patient is resting comfortably and his vital signs are reassuring on my evaluation.  Patient's blood pressure was 250/53 systolic, he is not tachypneic, his heart rate is in the 80s, and his oxygen saturation is 100% on room air.  Had a shared decision made conversation with patient and we agreed to get some screening blood work and imaging to make sure he does not have any significant normalities however I suspect he was slightly dehydrated from the mild diarrhea.  He will given a small bowel fluids.  Will check pulse oximetry with ambulation and orthostatics however if patient demonstrate stability and he has reassuring vital signs for period of time and his work-up is reassuring, dissipate discharge home for outpatient management of his Covid.  9:09 PM Nursing reported that the patient's orthostatics were positive and his blood pressure dropped to around 80 systolic when he stood up from sitting.  He will be given a liter of fluids and this will be reassessed.  After fluids, patient did not feel any lightheadedness.  He was able to stand up and had  slight drop in his blood pressure but he does not feel  symptomatic.  He does not want to wait another liter of fluids and repeat orthostatics at this time.  Patient's kidney function was slightly higher than before but it was similar.  Other labs reassuring.  Initial troponin negative.  Chest x-ray does not show pneumonia.  Patient has had no hypoxia during his entire ED stay and has not needed oxygen.  He is on room air.  Do not feel he needs oxygen at this time.  Clinically I suspect his symptoms related to COVID-19 infection with some mild dehydration.  He has not had any of these concerning vital signs that he had at home prompting him to be sent to the emergency department.  After our shared decision-making conversation.  We feel he safe for discharge home given his resolution of abnormal vital signs.  He will follow-up with PCP and will continue to monitor his vitals.  If any symptoms change or worsen, he knows to return for further management.  Final Clinical Impression(s) / ED Diagnoses Final diagnoses:  COVID-19  Orthostatic lightheadedness    Rx / DC Orders ED Discharge Orders    None      Clinical Impression: 1. COVID-19   2. Orthostatic lightheadedness     Disposition: Discharge  Condition: Good  I have discussed the results, Dx and Tx plan with the pt(& family if present). He/she/they expressed understanding and agree(s) with the plan. Discharge instructions discussed at great length. Strict return precautions discussed and pt &/or family have verbalized understanding of the instructions. No further questions at time of discharge.    New Prescriptions   No medications on file    Follow Up: Jarome Matin, MD [address] [place] [address] [phone]     [facility] DEPT [address] [id number] mc [address] [place] [address] [phone]         Dariella Gillihan, Canary Brim, MD 08/13/19 [phone]

## 2019-08-12 NOTE — ED Notes (Addendum)
Pt maintained oxygen saturations of 97% and above while ambulating.

## 2019-08-13 LAB — TROPONIN I (HIGH SENSITIVITY): Troponin I (High Sensitivity): 3 ng/L (ref <18)

## 2019-08-13 NOTE — Discharge Instructions (Signed)
Your history and work-up today are consistent with orthostatic hypotension leading to your low blood pressures when you stood up and felt lightheaded.  After fluids, this improved and your vitals were reassuring in the emergency department.  Your work-up was also otherwise reassuring with no evidence of pneumonia or significant electrolyte imbalance.  Your kidney function was slightly worse than prior likely related to dehydration.  Please try and drink fluids and stay hydrated as you get over your COVID-19 infection.  Please follow-up with your PCP.  If any symptoms change or worsen, please return to the nearest emergency department.

## 2019-08-13 NOTE — ED Notes (Signed)
Patient was verbalized discharge instructions. Pt had no further questions at this time. NAD. 

## 2019-12-21 NOTE — Patient Instructions (Addendum)
Please continue using your BiPAP regularly. While your insurance requires that you use BiPAP at least 4 hours each night on 70% of the nights, I recommend, that you not skip any nights and use it throughout the night if you can. Getting used to BiPAP and staying with the treatment long term does take time and patience and discipline. Untreated obstructive sleep apnea when it is moderate to severe can have an adverse impact on cardiovascular health and raise her risk for heart disease, arrhythmias, hypertension, congestive heart failure, stroke and diabetes. Untreated obstructive sleep apnea causes sleep disruption, nonrestorative sleep, and sleep deprivation. This can have an impact on your day to day functioning and cause daytime sleepiness and impairment of cognitive function, memory loss, mood disturbance, and problems focussing. Using BiPAP regularly can improve these symptoms.   Follow up in 1 year   Sleep Apnea Sleep apnea affects breathing during sleep. It causes breathing to stop for a short time or to become shallow. It can also increase the risk of:  Heart attack.  Stroke.  Being very overweight (obese).  Diabetes.  Heart failure.  Irregular heartbeat. The goal of treatment is to help you breathe normally again. What are the causes? There are three kinds of sleep apnea:  Obstructive sleep apnea. This is caused by a blocked or collapsed airway.  Central sleep apnea. This happens when the brain does not send the right signals to the muscles that control breathing.  Mixed sleep apnea. This is a combination of obstructive and central sleep apnea. The most common cause of this condition is a collapsed or blocked airway. This can happen if:  Your throat muscles are too relaxed.  Your tongue and tonsils are too large.  You are overweight.  Your airway is too small. What increases the risk?  Being overweight.  Smoking.  Having a small airway.  Being older.  Being  male.  Drinking alcohol.  Taking medicines to calm yourself (sedatives or tranquilizers).  Having family members with the condition. What are the signs or symptoms?  Trouble staying asleep.  Being sleepy or tired during the day.  Getting angry a lot.  Loud snoring.  Headaches in the morning.  Not being able to focus your mind (concentrate).  Forgetting things.  Less interest in sex.  Mood swings.  Personality changes.  Feelings of sadness (depression).  Waking up a lot during the night to pee (urinate).  Dry mouth.  Sore throat. How is this diagnosed?  Your medical history.  A physical exam.  A test that is done when you are sleeping (sleep study). The test is most often done in a sleep lab but may also be done at home. How is this treated?   Sleeping on your side.  Using a medicine to get rid of mucus in your nose (decongestant).  Avoiding the use of alcohol, medicines to help you relax, or certain pain medicines (narcotics).  Losing weight, if needed.  Changing your diet.  Not smoking.  Using a machine to open your airway while you sleep, such as: ? An oral appliance. This is a mouthpiece that shifts your lower jaw forward. ? A CPAP device. This device blows air through a mask when you breathe out (exhale). ? An EPAP device. This has valves that you put in each nostril. ? A BPAP device. This device blows air through a mask when you breathe in (inhale) and breathe out.  Having surgery if other treatments do not work. It is   important to get treatment for sleep apnea. Without treatment, it can lead to:  High blood pressure.  Coronary artery disease.  In men, not being able to have an erection (impotence).  Reduced thinking ability. Follow these instructions at home: Lifestyle  Make changes that your doctor recommends.  Eat a healthy diet.  Lose weight if needed.  Avoid alcohol, medicines to help you relax, and some pain  medicines.  Do not use any products that contain nicotine or tobacco, such as cigarettes, e-cigarettes, and chewing tobacco. If you need help quitting, ask your doctor. General instructions  Take over-the-counter and prescription medicines only as told by your doctor.  If you were given a machine to use while you sleep, use it only as told by your doctor.  If you are having surgery, make sure to tell your doctor you have sleep apnea. You may need to bring your device with you.  Keep all follow-up visits as told by your doctor. This is important. Contact a doctor if:  The machine that you were given to use during sleep bothers you or does not seem to be working.  You do not get better.  You get worse. Get help right away if:  Your chest hurts.  You have trouble breathing in enough air.  You have an uncomfortable feeling in your back, arms, or stomach.  You have trouble talking.  One side of your body feels weak.  A part of your face is hanging down. These symptoms may be an emergency. Do not wait to see if the symptoms will go away. Get medical help right away. Call your local emergency services (911 in the [place] Do not drive yourself to the hospital. Summary  This condition affects breathing during sleep.  The most common cause is a collapsed or blocked airway.  The goal of treatment is to help you breathe normally while you sleep. This information is not intended to replace advice given to you by your health care provider. Make sure you discuss any questions you have with your health care provider. Document Revised: 05/09/2018 Document Reviewed: 03/18/2018 Elsevier Patient Education  2020 [employer] Inc[employer]  

## 2019-12-21 NOTE — Progress Notes (Signed)
PATIENT: Alfred Stephenson DOB: 07/02/1951  REASON FOR VISIT: follow up HISTORY FROM: patient  Chief Complaint  Patient presents with  . Follow-up    room 2, with wife, cpap fu, pt would like to change mask      HISTORY OF PRESENT ILLNESS: Today 12/22/19 Alfred Stephenson is a 69 y.o. male here today for follow up for OSA on BiPAP. He reports that he is doing well on BiPAP therapy.  He is using BiPAP every night.  He is currently using a nasal pillow and is curious if a full facemask might be a better fit for him.  He has had some difficulty with the nasal pillows slipping on his face.  He denies any other concerns today.  Compliance report dated 11/21/2019 through 12/20/2019 reveals that he used CPAP 29 of the last 30 days for compliance of 97%.  He used CPAP greater than 4 hours 27 of the past 90 days for compliance of 90%.  Average usage was 7 hours and 28 minutes.  Residual AHI was 2.6 with IPAP of 9 cm of water and EPAP of 5 cm of water.  There was no significant leak noted.    HISTORY: (copied from [employer] note on 06/11/2018)  PDATE 11/6/2019[name] 69 year old male returns for follow-up for BiPAP compliance.  His daytime drowsiness is much improved he has nasal pillows for mask.  Compliance data dated 05/11/2018-06/09/2018 shows compliance greater than 4 hours at 97%.  Average usage 7 hours 45 minutes.  Pressure 5 to 9 cm.  Leak 95th percentile 0.9.  AHI 2.6.  ESS 6 FSS 15. He returns for reevaluation   UPDATE 7/2/19[name], 69 year old male returns for follow-up for initial BiPAP compliance.  He does complain with leak.  ESS 3 and his daytime drowsiness has improved.  Data dated 01/04/2018-01/06/2018 shows compliance greater than 4 hours at 100 percent.  Average usage 8 hours 37 minutes.  Pressure 5 to 9 cm leak 38.2%.  AHI 6.6.  He returns for reevaluation obstructive sleep apnea with BiPAP   11/12/18CDGeorge E Wilsonis a 69 y.o.male, seen here as in a referral  ( prolonged interval RV )from Dr.Patersonto be re- evaluated for OSA.  I have the pleasure of meeting with Mr. Alfred Stephenson today on 17 June 2017, the patient is an established formersleep labpatientof Dr Annamaria Boots  's who was evaluated in the year 2007, he had his last study in [place] placed on CPAP,but he did not stay in contact. He also did use his CPAP for a while and then discontinued after about for 5 years. Dr. Sharlett Iles attached a copy of the sleep study from 10 November 2009, the study was performed as a split-night protocol. The patient was diagnosed with 119 hypopneas, 18 apneas resulting in an AHI of 60.7, no REM sleep was noted, and oxygen nadir was 86% with only 1.4 minutes of desaturation, he was placed on CPAP but developed central apneas. There was also a strong supine AHI increase. We decided to start him on CPAP of 8 cm which had reduced the AHI to 4.0. He also had a high PLM arousal index was not aware of restless legs at the time.  In the meantime he had a broken nasal septum;this was repaired in 2007,he also had an posterior neck plate /neck fusion surgery, 2016 he had a TURP, He carries the diagnosis of sarcoidosis,and his chief complaint today is that he is sleeping too much,  sometimes short of breath, fatigue and daytime with a decreased level of energy-in the meantime restless legs have emerged. HTN, Hyperlipidemia. CKD 3. OSA, untreated.  Sleep habits are as follows: His bedtime has been later than in his working years and is now around10PM, when he retreats to his bedroom the bedroom is cool, and dark. Watches TV news in the bedroom however until midnight. His wife comes into the bedroom later and turns the TV off again. He is usually on his back while watching TV during the night. Which to a lateral. He sleeps on only one pillow. His wife sleeps with an oxygen concentrator. He may have 1 or 2 times nocturia each night,  and he usually can go right back to sleep.He does sometimes have vivid dreams but he is not known to [place] at night he is not a sleep walker either. He usually arises in the morning at whenever he feels like it. Most likely between 10 and11 AM. The patient estimates that he sleeps more than 10 hours at night, and he may still sleep feel sleepy in the daytime. This has concerned his wife greatly, he endorsed the Epworth sleepiness score at 8 points, the fatigue severity score of 27 points in the geriatric depression score at only 2out of 15 points.   REVIEW OF SYSTEMS: Out of a complete 14 system review of symptoms, the patient complains only of the following symptoms, none and all other reviewed systems are negative.  ESS: 11 FSS: 23  ALLERGIES: No Known Allergies  HOME MEDICATIONS: Outpatient Medications Prior to Visit  Medication Sig Dispense Refill  . amLODipine (NORVASC) 10 MG tablet Take 10 mg by mouth daily.     [name] aspirin EC 81 MG tablet Take 81 mg by mouth daily.    . pravastatin (PRAVACHOL) 40 MG tablet Take 40 mg by mouth daily.    [name] PRISTIQ 100 MG 24 hr tablet Take 100 mg by mouth every morning.     [name] telmisartan (MICARDIS) 80 MG tablet Take 80 mg by mouth daily.      No facility-administered medications prior to visit.    PAST MEDICAL HISTORY: Past Medical History:  Diagnosis Date  . Anxiety   . Arthritis    arthritis left index finger   . Chronic kidney disease   . Hyperlipidemia   . Hypertension   . Sarcoidosis   . Sleep apnea    cpap does not know settings, 02/04/18 BiPAP    PAST SURGICAL HISTORY: Past Surgical History:  Procedure Laterality Date  . BACK SURGERY  2007  . CARDIAC CATHETERIZATION    . GREEN LIGHT LASER TURP (TRANSURETHRAL RESECTION OF PROSTATE  08/22/2012   Procedure: GREEN LIGHT LASER TURP (TRANSURETHRAL RESECTION OF PROSTATE;  Surgeon: Antony Haste, MD;  Location: [facility];  Service: Urology;  Laterality: N/A;      . LUNG BIOPSY   1970  . NASAL SINUS SURGERY  2006  . NECK SURGERY  2007    FAMILY HISTORY: Family History  Problem Relation Age of Onset  . Heart disease Father   . Stroke Father   . CAD Father     SOCIAL HISTORY: Social History   Socioeconomic History  . Marital status: Married    Spouse name: Olegario Messier  . Number of children: Not on file  . Years of education: Not on file  . Highest education level: Not on file  Occupational History  . 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 Smoking status:  Never Smoker  . Smokeless tobacco: Never Used  Substance and Sexual Activity  . Alcohol use: No  . Drug use: No  . Sexual activity: Not on file  Other Topics Concern  . Not on file  Social History Narrative  . Not on file   Social Determinants of Health   Financial Resource Strain:   . Difficulty of Paying Living Expenses:   Food Insecurity:   . Worried About Programme researcher, broadcasting/film/video in the Last Year:   . Barista in the Last Year:   Transportation Needs:   . Freight forwarder (Medical):   [name] Lack of Transportation (Non-Medical):   Physical Activity:   . Days of Exercise per Week:   . Minutes of Exercise per Session:   Stress:   . Feeling of Stress :   Social Connections:   . Frequency of Communication with Friends and Family:   . Frequency of Social Gatherings with Friends and Family:   . Attends Religious Services:   . Active Member of Clubs or Organizations:   . Attends Banker Meetings:   [name] Marital Status:   Intimate Partner Violence:   . Fear of Current or Ex-Partner:   . Emotionally Abused:   [name] Physically Abused:   . Sexually Abused:       PHYSICAL EXAM  Vitals:   12/22/19 0809  BP: 125/83  Pulse: (!) 58  Weight: 210 lb (95.3 kg)  Height: 5\' 11"  (1.803 m)   Body mass index is 29.29 kg/m.  Generalized: Well developed, in no acute distress  Cardiology: normal rate and rhythm, no murmur noted Respiratory: clear to auscultation  bilaterally  Neurological examination  Mentation: Alert oriented to time, place, history taking. Follows all commands speech and language fluent Cranial nerve II-XII: Pupils were equal round reactive to light. Extraocular movements were full, visual field were full  Motor: The motor testing reveals 5 over 5 strength of all 4 extremities. Good symmetric motor tone is noted throughout.  Gait and station: Gait is normal.   DIAGNOSTIC DATA (LABS, IMAGING, TESTING) - I reviewed patient records, labs, notes, testing and imaging myself where available.  No flowsheet data found.   Lab Results  Component Value Date   WBC 6.9 08/12/2019   HGB 15.9 08/12/2019   HCT 48.0 08/12/2019   MCV 93.0 08/12/2019   PLT 234 08/12/2019      Component Value Date/Time   NA 136 08/12/2019 2110   NA 137 07/21/2018 1134   K 4.8 08/12/2019 2110   CL 103 08/12/2019 2110   CO2 22 08/12/2019 2110   GLUCOSE 95 08/12/2019 2110   BUN 33 (H) 08/12/2019 2110   BUN 21 07/21/2018 1134   CREATININE 1.66 (H) 08/12/2019 2110   CALCIUM 9.0 08/12/2019 2110   PROT 7.6 08/12/2019 2110   ALBUMIN 4.1 08/12/2019 2110   AST 21 08/12/2019 2110   ALT 15 08/12/2019 2110   ALKPHOS 95 08/12/2019 2110   BILITOT 0.3 08/12/2019 2110   GFRNONAA 41 (L) 08/12/2019 2110   GFRAA 48 (L) 08/12/2019 2110   No results found for: CHOL, HDL, LDLCALC, LDLDIRECT, TRIG, CHOLHDL No results found for: 2111 No results found for: VITAMINB12      ASSESSMENT AND PLAN 69 y.o. year old male  has a past medical history of Anxiety, Arthritis, Chronic kidney disease, Hyperlipidemia, Hypertension, Sarcoidosis, and Sleep apnea. here with     ICD-10-CM   1. Obstructive sleep apnea treated with BiPAP  G47.33 For home use only DME Bipap    For home use only DME Bipap    CANCELED: For home use only DME continuous positive airway pressure (CPAP)    CANCELED: For home use only DME continuous positive airway pressure (CPAP)    Mr. Borum is doing  very well with BiPAP therapy at home.  Compliance report reveals excellent compliance.  He was encouraged to continue using CPAP nightly and for greater than 4 hours each night.  He does request to have a mask refitting as he is interested in trying a full facemask.  Orders for mask refitting and supplies have been placed today.  He will continue close follow-up with primary care.  Start habits with well-balanced diet, regular exercise and adequate hydration discussed.  He will follow-up with me in 1 year, sooner if needed.  He verbalizes understanding and agreement with this plan.   Orders Placed This Encounter  Procedures  . For home use only DME Bipap    Mask refitting    Order Specific Question:   Length of Need    Answer:   Lifetime    Order Specific Question:   Inspiratory pressure    Answer:   OTHER SEE COMMENTS    Order Specific Question:   Expiratory pressure    Answer:   OTHER SEE COMMENTS  . For home use only DME Bipap    Supplies    Order Specific Question:   Length of Need    Answer:   Lifetime    Order Specific Question:   Inspiratory pressure    Answer:   OTHER SEE COMMENTS    Order Specific Question:   Expiratory pressure    Answer:   OTHER SEE COMMENTS     No orders of the defined types were placed in this encounter.     I spent 15 minutes with the patient. 50% of this time was spent counseling and educating patient on plan of care and medications.    Shawnie Dapper, FNP-C 12/22/2019, 8:42 AM [facility] [address] [place], [place] [address] [phone]

## 2019-12-22 ENCOUNTER — Encounter: Payer: Self-pay | Admitting: Family Medicine

## 2019-12-22 ENCOUNTER — Ambulatory Visit (INDEPENDENT_AMBULATORY_CARE_PROVIDER_SITE_OTHER): Payer: Medicare Other | Admitting: Family Medicine

## 2019-12-22 ENCOUNTER — Other Ambulatory Visit: Payer: Self-pay

## 2019-12-22 VITALS — BP 125/83 | HR 58 | Ht 71.0 in | Wt 210.0 lb

## 2019-12-22 DIAGNOSIS — G4733 Obstructive sleep apnea (adult) (pediatric): Secondary | ICD-10-CM | POA: Diagnosis not present

## 2019-12-22 NOTE — Progress Notes (Signed)
A community message has been sent to [employer]  

## 2020-05-17 ENCOUNTER — Other Ambulatory Visit: Payer: Self-pay | Admitting: Internal Medicine

## 2020-05-17 DIAGNOSIS — R972 Elevated prostate specific antigen [PSA]: Secondary | ICD-10-CM

## 2020-06-18 ENCOUNTER — Ambulatory Visit
Admission: RE | Admit: 2020-06-18 | Discharge: 2020-06-18 | Disposition: A | Discharge status: Home / Self Care | Payer: Medicare Other | Source: Ambulatory Visit | Attending: Internal Medicine | Admitting: Internal Medicine

## 2020-06-18 ENCOUNTER — Other Ambulatory Visit: Payer: Self-pay

## 2020-06-18 DIAGNOSIS — R972 Elevated prostate specific antigen [PSA]: Secondary | ICD-10-CM

## 2020-06-18 MED ORDER — GADOBENATE DIMEGLUMINE 529 MG/ML IV SOLN
20.0000 mL | Freq: Once | INTRAVENOUS | Status: AC | PRN
  Administered 2020-06-18: 20 mL via INTRAVENOUS

## 2020-07-22 DIAGNOSIS — E6609 Other obesity due to excess calories: Secondary | ICD-10-CM | POA: Insufficient documentation

## 2020-07-22 DIAGNOSIS — I10 Essential (primary) hypertension: Secondary | ICD-10-CM | POA: Insufficient documentation

## 2020-12-22 NOTE — Progress Notes (Deleted)
PATIENT: Alfred Stephenson DOB: Apr 23, 1951  REASON FOR VISIT: follow up HISTORY FROM: patient  No chief complaint on file.    HISTORY OF PRESENT ILLNESS: 12/22/20 ALL: Alfred Stephenson returns for follow up for OSA on BiPAP.       12/22/2019 ALL: Alfred Stephenson is a 70 y.o. male here today for follow up for OSA on BiPAP. He reports that he is doing well on BiPAP therapy.  He is using BiPAP every night.  He is currently using a nasal pillow and is curious if a full facemask might be a better fit for him.  He has had some difficulty with the nasal pillows slipping on his face.  He denies any other concerns today.  Compliance report dated 11/21/2019 through 12/20/2019 reveals that he used CPAP 29 of the last 30 days for compliance of 97%.  He used CPAP greater than 4 hours 27 of the past 90 days for compliance of 90%.  Average usage was 7 hours and 28 minutes.  Residual AHI was 2.6 with IPAP of 9 cm of water and EPAP of 5 cm of water.  There was no significant leak noted.    HISTORY: (copied from [employer] note on 06/11/2018)  PDATE 11/6/2019[name] 70 year old male returns for follow-up for BiPAP compliance.  His daytime drowsiness is much improved he has nasal pillows for mask.  Compliance data dated 05/11/2018-06/09/2018 shows compliance greater than 4 hours at 97%.  Average usage 7 hours 45 minutes.  Pressure 5 to 9 cm.  Leak 95th percentile 0.9.  AHI 2.6.  ESS 6 FSS 15. He returns for reevaluation   UPDATE 7/2/19[name], 70 year old male returns for follow-up for initial BiPAP compliance.  He does complain with leak.  ESS 3 and his daytime drowsiness has improved.  Data dated 01/04/2018-01/06/2018 shows compliance greater than 4 hours at 100 percent.  Average usage 8 hours 37 minutes.  Pressure 5 to 9 cm leak 38.2%.  AHI 6.6.  He returns for reevaluation obstructive sleep apnea with BiPAP   11/12/18CDGeorge E Wilsonis a 70 y.o.male, seen here as in a referral ( prolonged  interval RV )from Dr.Patersonto be re- evaluated for OSA.  I have the pleasure of meeting with Alfred Stephenson today on 17 June 2017, the patient is an established formersleep labpatientof Dr Maple Hudson  's who was evaluated in the year 2007, he had his last [facility] in 2011[facility]- hewas placed on CPAP,but he did not stay in contact. He also did use his CPAP for a while and then discontinued after about for 5 years. Dr. Jarold Motto attached a copy of the sleep study from 10 November 2009, the study was performed as a split-night protocol. The patient was diagnosed with 119 hypopneas, 18 apneas resulting in an AHI of 60.7, no REM sleep was noted, and oxygen nadir was 86% with only 1.4 minutes of desaturation, he was placed on CPAP but developed central apneas. There was also a strong supine AHI increase. We decided to start him on CPAP of 8 cm which had reduced the AHI to 4.0. He also had a high PLM arousal index was not aware of restless legs at the time.  In the meantime he had a broken nasal septum;this was repaired in 2007,he also had an posterior neck plate /neck fusion surgery, 2016 he had a TURP, He carries the diagnosis of sarcoidosis,and his chief complaint today is that he is sleeping too much, sometimes short of  breath, fatigue and daytime with a decreased level of energy-in the meantime restless legs have emerged. HTN, Hyperlipidemia. CKD 3. OSA, untreated.  Sleep habits are as follows: His bedtime has been later than in his working years and is now around10PM, when he retreats to his bedroom the bedroom is cool, and dark. Watches TV news in the bedroom however until midnight. His wife comes into the bedroom later and turns the TV off again. He is usually on his back while watching TV during the night. Which to a lateral. He sleeps on only one pillow. His wife sleeps with an oxygen concentrator. He may have 1 or 2 times nocturia each night, and he  usually can go right back to sleep.He does sometimes have vivid dreams but he is not known to [place] at night he is not a sleep walker either. He usually arises in the morning at whenever he feels like it. Most likely between 10 and11 AM. The patient estimates that he sleeps more than 10 hours at night, and he may still sleep feel sleepy in the daytime. This has concerned his wife greatly, he endorsed the Epworth sleepiness score at 8 points, the fatigue severity score of 27 points in the geriatric depression score at only 2out of 15 points.   REVIEW OF SYSTEMS: Out of a complete 14 system review of symptoms, the patient complains only of the following symptoms, none and all other reviewed systems are negative.  ESS: 11 FSS: 23  ALLERGIES: No Known Allergies  HOME MEDICATIONS: Outpatient Medications Prior to Visit  Medication Sig Dispense Refill  . amLODipine (NORVASC) 10 MG tablet Take 10 mg by mouth daily.     [name] aspirin EC 81 MG tablet Take 81 mg by mouth daily.    . pravastatin (PRAVACHOL) 40 MG tablet Take 40 mg by mouth daily.    [name] PRISTIQ 100 MG 24 hr tablet Take 100 mg by mouth every morning.     [name] telmisartan (MICARDIS) 80 MG tablet Take 80 mg by mouth daily.      No facility-administered medications prior to visit.    PAST MEDICAL HISTORY: Past Medical History:  Diagnosis Date  . Anxiety   . Arthritis    arthritis left index finger   . Chronic kidney disease   . Hyperlipidemia   . Hypertension   . Sarcoidosis   . Sleep apnea    cpap does not know settings, 02/04/18 BiPAP    PAST SURGICAL HISTORY: Past Surgical History:  Procedure Laterality Date  . BACK SURGERY  2007  . CARDIAC CATHETERIZATION    . GREEN LIGHT LASER TURP (TRANSURETHRAL RESECTION OF PROSTATE  08/22/2012   Procedure: GREEN LIGHT LASER TURP (TRANSURETHRAL RESECTION OF PROSTATE;  Surgeon: Antony Haste, MD;  Location: [facility];  Service: Urology;  Laterality: N/A;      . LUNG BIOPSY  1970   . NASAL SINUS SURGERY  2006  . NECK SURGERY  2007    FAMILY HISTORY: Family History  Problem Relation Age of Onset  . Heart disease Father   . Stroke Father   . CAD Father     SOCIAL HISTORY: Social History   Socioeconomic History  . Marital status: Married    Spouse name: Olegario Messier  . Number of children: Not on file  . Years of education: Not on file  . Highest education level: Not on file  Occupational History  . 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 Smoking status: Never Smoker  .  Smokeless tobacco: Never Used  Substance and Sexual Activity  . Alcohol use: No  . Drug use: No  . Sexual activity: Not on file  Other Topics Concern  . Not on file  Social History Narrative  . Not on file   Social Determinants of Health   Financial Resource Strain: Not on file  Food Insecurity: Not on file  Transportation Needs: Not on file  Physical Activity: Not on file  Stress: Not on file  Social Connections: Not on file  Intimate Partner Violence: Not on file      PHYSICAL EXAM  There were no vitals filed for this visit. There is no height or weight on file to calculate BMI.  Generalized: Well developed, in no acute distress  Cardiology: normal rate and rhythm, no murmur noted Respiratory: clear to auscultation bilaterally  Neurological examination  Mentation: Alert oriented to time, place, history taking. Follows all commands speech and language fluent Cranial nerve II-XII: Pupils were equal round reactive to light. Extraocular movements were full, visual field were full  Motor: The motor testing reveals 5 over 5 strength of all 4 extremities. Good symmetric motor tone is noted throughout.  Gait and station: Gait is normal.   DIAGNOSTIC DATA (LABS, IMAGING, TESTING) - I reviewed patient records, labs, notes, testing and imaging myself where available.  No flowsheet data found.   Lab Results  Component Value Date   WBC 6.9 08/12/2019   HGB 15.9  08/12/2019   HCT 48.0 08/12/2019   MCV 93.0 08/12/2019   PLT 234 08/12/2019      Component Value Date/Time   NA 136 08/12/2019 2110   NA 137 07/21/2018 1134   K 4.8 08/12/2019 2110   CL 103 08/12/2019 2110   CO2 22 08/12/2019 2110   GLUCOSE 95 08/12/2019 2110   BUN 33 (H) 08/12/2019 2110   BUN 21 07/21/2018 1134   CREATININE 1.66 (H) 08/12/2019 2110   CALCIUM 9.0 08/12/2019 2110   PROT 7.6 08/12/2019 2110   ALBUMIN 4.1 08/12/2019 2110   AST 21 08/12/2019 2110   ALT 15 08/12/2019 2110   ALKPHOS 95 08/12/2019 2110   BILITOT 0.3 08/12/2019 2110   GFRNONAA 41 (L) 08/12/2019 2110   GFRAA 48 (L) 08/12/2019 2110   No results found for: CHOL, HDL, LDLCALC, LDLDIRECT, TRIG, CHOLHDL No results found for: WUXL2G No results found for: VITAMINB12      ASSESSMENT AND PLAN 70 y.o. year old male  has a past medical history of Anxiety, Arthritis, Chronic kidney disease, Hyperlipidemia, Hypertension, Sarcoidosis, and Sleep apnea. here with   No diagnosis found.   Alfred. Blasius is doing very well with BiPAP therapy at home.  Compliance report reveals excellent compliance.  He was encouraged to continue using CPAP nightly and for greater than 4 hours each night.  He does request to have a mask refitting as he is interested in trying a full facemask.  Orders for mask refitting and supplies have been placed today.  He will continue close follow-up with primary care.  Start habits with well-balanced diet, regular exercise and adequate hydration discussed.  He will follow-up with me in 1 year, sooner if needed.  He verbalizes understanding and agreement with this plan.   No orders of the defined types were placed in this encounter.    No orders of the defined types were placed in this encounter.       Shawnie Dapper, FNP-C 12/22/2020, 7:51 AM [facility] [address]  [place], [place] [address] [phone]

## 2020-12-26 ENCOUNTER — Encounter: Payer: Self-pay | Admitting: Family Medicine

## 2020-12-26 ENCOUNTER — Ambulatory Visit: Payer: Medicare Other | Admitting: Family Medicine

## 2020-12-26 DIAGNOSIS — G4733 Obstructive sleep apnea (adult) (pediatric): Secondary | ICD-10-CM

## 2021-07-21 DIAGNOSIS — M5001 Cervical disc disorder with myelopathy,  high cervical region: Secondary | ICD-10-CM | POA: Insufficient documentation

## 2021-09-07 ENCOUNTER — Telehealth: Payer: Self-pay | Admitting: Cardiology

## 2021-09-07 NOTE — Telephone Encounter (Signed)
Patient c/o Palpitations:  High priority if patient c/o lightheadedness, shortness of breath, or chest pain  How long have you had palpitations/irregular HR/ Afib? Are you having the symptoms now? Was unaware he had irregular HR until today, is actively having symptoms   Are you currently experiencing lightheadedness, SOB or CP? No   Do you have a history of afib (atrial fibrillation) or irregular heart rhythm? Not they know of   Have you checked your BP or HR? (document readings if available):  09/07/21 122/82 HR 72   Are you experiencing any other symptoms? No    Home health nurse noticed when taking the patients vitals today that he had an irregular HR. Nor pt or wife are aware of him ever having this in the past, no other symptoms are present. Patient was scheduled for the first available appt with Dr. Kirtland Bouchard on 09/28/21 at the [facility] office.

## 2021-09-11 NOTE — Telephone Encounter (Signed)
Left VM for pt to call back.

## 2021-09-12 ENCOUNTER — Ambulatory Visit: Payer: Medicare Other

## 2021-09-12 NOTE — Telephone Encounter (Signed)
Appointment has been made for nurse visit.

## 2021-09-13 ENCOUNTER — Other Ambulatory Visit: Payer: Self-pay

## 2021-09-13 ENCOUNTER — Ambulatory Visit (INDEPENDENT_AMBULATORY_CARE_PROVIDER_SITE_OTHER): Payer: Medicare Other

## 2021-09-13 VITALS — BP 128/82 | HR 77 | Ht 71.0 in | Wt 223.0 lb

## 2021-09-13 DIAGNOSIS — I499 Cardiac arrhythmia, unspecified: Secondary | ICD-10-CM | POA: Diagnosis not present

## 2021-09-13 NOTE — Progress Notes (Signed)
Patient came in today for an EKG nurse visit after a telephone call on 09/07/21 and recommendations from Dr. Bing Matter. This EKG was completed along with the patients vitals. All vital signs are stable at this time. The EKG was reviewed by Dr. Dulce Sellar and it is normal. The patient was discharged and will follow up with Dr. Bing Matter as scheduled.

## 2021-09-25 DIAGNOSIS — F419 Anxiety disorder, unspecified: Secondary | ICD-10-CM | POA: Insufficient documentation

## 2021-09-25 DIAGNOSIS — M1990 Unspecified osteoarthritis, unspecified site: Secondary | ICD-10-CM | POA: Insufficient documentation

## 2021-09-25 DIAGNOSIS — N189 Chronic kidney disease, unspecified: Secondary | ICD-10-CM | POA: Insufficient documentation

## 2021-09-28 ENCOUNTER — Encounter: Payer: Self-pay | Admitting: Cardiology

## 2021-09-28 ENCOUNTER — Other Ambulatory Visit: Payer: Self-pay

## 2021-09-28 ENCOUNTER — Ambulatory Visit (INDEPENDENT_AMBULATORY_CARE_PROVIDER_SITE_OTHER): Payer: Medicare Other | Admitting: Cardiology

## 2021-09-28 ENCOUNTER — Ambulatory Visit (INDEPENDENT_AMBULATORY_CARE_PROVIDER_SITE_OTHER): Payer: Medicare Other

## 2021-09-28 VITALS — BP 122/64 | HR 80 | Ht 71.0 in | Wt 214.1 lb

## 2021-09-28 DIAGNOSIS — Z01810 Encounter for preprocedural cardiovascular examination: Secondary | ICD-10-CM

## 2021-09-28 DIAGNOSIS — R002 Palpitations: Secondary | ICD-10-CM

## 2021-09-28 DIAGNOSIS — M5001 Cervical disc disorder with myelopathy,  high cervical region: Secondary | ICD-10-CM

## 2021-09-28 DIAGNOSIS — I10 Essential (primary) hypertension: Secondary | ICD-10-CM

## 2021-09-28 DIAGNOSIS — I4510 Unspecified right bundle-branch block: Secondary | ICD-10-CM | POA: Diagnosis not present

## 2021-09-28 DIAGNOSIS — G4733 Obstructive sleep apnea (adult) (pediatric): Secondary | ICD-10-CM

## 2021-09-28 DIAGNOSIS — R0609 Other forms of dyspnea: Secondary | ICD-10-CM

## 2021-09-28 NOTE — Patient Instructions (Addendum)
Medication Instructions:  NONE *If you need a refill on your cardiac medications before your next appointment, please call your pharmacy*   Lab Work: NONE If you have labs (blood work) drawn today and your tests are completely normal, you will receive your results only by: Alvan (if you have MyChart) OR A paper copy in the mail If you have any lab test that is abnormal or we need to change your treatment, we will call you to review the results.   Testing/Procedures: A zio monitor was ordered today. It will remain on for 14 days. You will then return monitor and event diary in provided box. It takes 1-2 weeks for report to be downloaded and returned to [place]. We will call you with the results. If monitor falls off or has orange flashing light, please call Zio for further instructions.    Follow-Up: At [facility], you and your health needs are our priority.  As part of our continuing mission to provide you with exceptional heart care, we have created designated Provider Care Teams.  These Care Teams include your primary Cardiologist (physician) and Advanced Practice Providers (APPs -  Physician Assistants and Nurse Practitioners) who all work together to provide you with the care you need, when you need it.  We recommend signing up for the patient portal called "MyChart".  Sign up information is provided on this After Visit Summary.  MyChart is used to connect with patients for Virtual Visits (Telemedicine).  Patients are able to view lab/test results, encounter notes, upcoming appointments, etc.  Non-urgent messages can be sent to your provider as well.   To learn more about what you can do with MyChart, go to [email or web address].    Your next appointment:   6 months  The format for your next appointment:   In Person  Provider:   Jenne Campus, MD    Other Instructions NONE

## 2021-09-28 NOTE — Progress Notes (Signed)
Cardiology Office Note:    Date:  09/28/2021   ID:  Alfred Stephenson, DOB [birth year], MRN [id number]  PCP:  Garlan Fillers, MD  Cardiologist:  Gypsy Balsam, MD    Referring MD: Garlan Fillers, MD   No chief complaint on file. I need to have neck surgery on top of that I was find to have some extrasystole  History of Present Illness:    Alfred Stephenson is a 71 y.o. male with past medical history significant for sarcoidosis, obstructive sleep apnea, essential hypertension, dyslipidemia.  He presented to our office because of 2 reasons first of all he did not have insurance nurse visit who listened to his heart some irregularities he was sent to [place] to have EKG done likely there was no atrial fibrillation.  Second issue is the fact that he required neck surgery which look like will be done under general anesthesia.  Cardiac wise he is doing very well.  He is completely unaware of any irregularity of his heartbeat.  Denies have any chest pain tightness squeezing pressure burning chest.  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  I think he is really he does 4 months.  Past Medical History:  Diagnosis Date   Anxiety    Arthritis    arthritis left index finger    Chronic kidney disease    Hyperlipidemia    Hypertension    Sarcoidosis    Sleep apnea    cpap does not know settings, 02/04/18 BiPAP    Past Surgical History:  Procedure Laterality Date   BACK SURGERY  2007   CARDIAC CATHETERIZATION     GREEN LIGHT LASER TURP (TRANSURETHRAL RESECTION OF PROSTATE  08/22/2012   Procedure: GREEN LIGHT LASER TURP (TRANSURETHRAL RESECTION OF PROSTATE;  Surgeon: Antony Haste, MD;  Location: [facility];  Service: Urology;  Laterality: N/A;       LUNG BIOPSY  1970   NASAL SINUS SURGERY  2006   NECK SURGERY  2007    Current Medications: Current Meds  Medication Sig   amLODipine (NORVASC) 10 MG tablet Take 10 mg  by mouth daily.    aspirin EC 81 MG tablet Take 81 mg by mouth daily.   pravastatin (PRAVACHOL) 40 MG tablet Take 40 mg by mouth daily.   PRISTIQ 100 MG 24 hr tablet Take 100 mg by mouth every morning.    telmisartan (MICARDIS) 80 MG tablet Take 80 mg by mouth daily.      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[employer]  Tobacco Use   Smoking status: Never    Passive exposure: Never   Smokeless tobacco: Never  Vaping Use   Vaping Use: Never used  Substance and Sexual Activity   Alcohol use: No   Drug use: No   Sexual activity: Not on file  Other Topics Concern   Not on file  Social History Narrative   Not on file   Social Determinants of Health   Financial Resource Strain: Not on file  Food Insecurity: Not on file  Transportation Needs: Not on file  Physical Activity: Not on file  Stress: Not on file  Social Connections: Not on file     Family History: The patient's family history includes CAD in his father; Heart disease in his father; Stroke in his father. ROS:   Please see the history of present illness.    All 14 point review of systems negative except as described per history of present illness  EKGs/Labs/Other Studies Reviewed:      Recent Labs: No results found for requested labs within last 8760 hours.  Recent Lipid Panel No results found for: CHOL, TRIG, HDL, CHOLHDL, VLDL, LDLCALC, LDLDIRECT  Physical Exam:    VS:  BP 122/64 (BP Location: Left Arm)    Pulse 80    Ht 5\' 11"  (1.803 m)    Wt 214 lb 1.3 oz (97.1 kg)    SpO2 97%    BMI 29.86 kg/m     Wt Readings from Last 3 Encounters:  09/28/21 214 lb 1.3 oz (97.1 kg)  09/13/21 223 lb (101.2 kg)  06/18/20 215 lb (97.5 kg)     GEN:  Well nourished, well developed in  no acute distress HEENT: Normal NECK: No JVD; No carotid bruits LYMPHATICS: No lymphadenopathy CARDIAC: RRR, no murmurs, no rubs, no gallops RESPIRATORY:  Clear to auscultation without rales, wheezing or rhonchi  ABDOMEN: Soft, non-tender, non-distended MUSCULOSKELETAL:  No edema; No deformity  SKIN: Warm and dry LOWER EXTREMITIES: no swelling NEUROLOGIC:  Alert and oriented x 3 PSYCHIATRIC:  Normal affect   ASSESSMENT:    1. Right bundle branch block   2. Essential hypertension   3. Obstructive sleep apnea treated with BiPAP   4. 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 Dyspnea on exertion   6. Palpitations   7. Preop cardiovascular exam    PLAN:    In order of problems listed above:  Right bundle branch which is chronic noted no new changes Essential hypertension: Blood pressure seems to be well controlled I will continue present medications. Obstructive sleep apnea that being followed by internal medicine team Cervical disc disorder she is scheduled to have surgery.  From cardiac standpoint to view should be appropriate to proceed with surgery with no significant risk.  He can walk climb stairs and aggressively exercise with no difficulties.  He works carrying some heavy objects with no problems therefore I think easily he does 4 METS without any cardiac symptoms therefore should be fine to proceed with surgery. Palpitations/irregular heartbeats.  We will put monitor on him. Cardiovascular preop evaluation: Discussion as above   Medication Adjustments/Labs and Tests Ordered: Current medicines are reviewed at length with the patient today.  Concerns regarding medicines are outlined above.  No orders of the defined types were placed in this encounter.  Medication changes: No orders of the defined types were placed in this encounter.   Signed, 06/20/20, MD, [facility] 09/28/2021 3:54 PM    [facility]

## 2021-09-28 NOTE — Addendum Note (Signed)
Addended by: Darius Bump B on: 09/28/2021 04:13 PM   Modules accepted: Orders

## 2021-10-05 NOTE — Addendum Note (Signed)
Addended by: Roxanne Mins I on: 10/05/2021 09:47 AM   Modules accepted: Orders

## 2021-10-20 DIAGNOSIS — J209 Acute bronchitis, unspecified: Secondary | ICD-10-CM | POA: Insufficient documentation

## 2021-10-26 ENCOUNTER — Telehealth: Payer: Self-pay

## 2021-10-26 ENCOUNTER — Other Ambulatory Visit: Payer: Self-pay | Admitting: Cardiology

## 2021-10-26 MED ORDER — METOPROLOL TARTRATE 25 MG PO TABS: 25.0000 mg | ORAL_TABLET | Freq: Two times a day (BID) | ORAL | 1 refills | Status: DC

## 2021-10-26 NOTE — Telephone Encounter (Signed)
Pt wants wife to be explained about his results ?

## 2021-10-26 NOTE — Telephone Encounter (Signed)
-----   Message from Georgeanna Lea, MD sent at 10/20/2021 12:44 PM EDT ----- ?Monitor showed no evidence of atrial fibrillation.  Have a frequent supraventricular ectopy total burden 11.5%.  Please start metoprolol titrate 25 twice daily ?

## 2021-10-26 NOTE — Telephone Encounter (Signed)
Patient notified of results and recommendation and agreed with plan. Rx sent to confirmed pharmacy.  ?

## 2021-10-26 NOTE — Telephone Encounter (Signed)
Called the patients wife and explained the results of the patient's test and the medication that was prescribed. Patient's wife had no further questions at this time. ?

## 2021-12-11 ENCOUNTER — Ambulatory Visit: Payer: Medicare Other

## 2021-12-11 ENCOUNTER — Ambulatory Visit (INDEPENDENT_AMBULATORY_CARE_PROVIDER_SITE_OTHER): Payer: Medicare Other | Admitting: Podiatry

## 2021-12-11 ENCOUNTER — Other Ambulatory Visit: Payer: Self-pay | Admitting: Podiatry

## 2021-12-11 ENCOUNTER — Ambulatory Visit (INDEPENDENT_AMBULATORY_CARE_PROVIDER_SITE_OTHER): Payer: Medicare Other

## 2021-12-11 ENCOUNTER — Encounter: Payer: Self-pay | Admitting: Podiatry

## 2021-12-11 DIAGNOSIS — M79674 Pain in right toe(s): Secondary | ICD-10-CM

## 2021-12-11 DIAGNOSIS — M778 Other enthesopathies, not elsewhere classified: Secondary | ICD-10-CM

## 2021-12-11 DIAGNOSIS — M722 Plantar fascial fibromatosis: Secondary | ICD-10-CM

## 2021-12-11 DIAGNOSIS — B351 Tinea unguium: Secondary | ICD-10-CM

## 2021-12-11 DIAGNOSIS — M79675 Pain in left toe(s): Secondary | ICD-10-CM | POA: Diagnosis not present

## 2021-12-11 NOTE — Progress Notes (Signed)
Patient seen for eval for foot orthotics. Patient was unaware his medicare plan will not cover them. Financial responsibility was explained and patient wished to think about it. Patient is to resume plan of care at his convenience. ?

## 2021-12-11 NOTE — Progress Notes (Signed)
? ?  SUBJECTIVE ?Patient presents to office today complaining of elongated, thickened nails that cause pain while ambulating in shoes.  Patient is unable to trim their own nails. Patient is here for further evaluation and treatment. ? ?Past Medical History:  ?Diagnosis Date  ? Anxiety   ? Arthritis   ? arthritis left index finger   ? Chronic kidney disease   ? Hyperlipidemia   ? Hypertension   ? Sarcoidosis   ? Sleep apnea   ? cpap does not know settings, 02/04/18 BiPAP  ? ? ?OBJECTIVE ?General Patient is awake, alert, and oriented x 3 and in no acute distress. ?Derm Skin is dry and supple bilateral. Negative open lesions or macerations. Remaining integument unremarkable. Nails are tender, long, thickened and dystrophic with subungual debris, consistent with onychomycosis, 1-5 bilateral. No signs of infection noted. ?Vasc  DP and PT pedal pulses palpable bilaterally. Temperature gradient within normal limits.  ?Neuro Epicritic and protective threshold sensation grossly intact bilaterally.  ?Musculoskeletal Exam No symptomatic pedal deformities noted bilateral. Muscular strength within normal limits.  Pain on palpation throughout the heels of the bilateral feet ?Radiographic exam B/L Feet normal osseous mineralization.  Large plantar heel spur with calcification of the plantar fascia noted on lateral view of the right foot.  Posterior heel spur noted.  No fractures identified.  Joint spaces mostly preserved. ? ?ASSESSMENT ?1.  Pain due to onychomycosis of toenails both ?2.  Plantar fasciitis bilateral ? ?PLAN OF CARE ?1. Patient evaluated today.  ?2. Instructed to maintain good pedal hygiene and foot care.  ?3. Mechanical debridement of nails 1-5 bilaterally performed using a nail nipper. Filed with dremel without incident.  ?4.  Appointment with Pedorthist for custom molded orthotics return to clinic in 3 mos.  ? ? ?Felecia Shelling, DPM ?[facility] ? ?Dr. Felecia Shelling, DPM  ?  ?[address]                                      ?[place], [place] [address]                ?Office [phone]  ?Fax [phone] ? ? ? ? ?

## 2021-12-20 DIAGNOSIS — R351 Nocturia: Secondary | ICD-10-CM | POA: Insufficient documentation

## 2022-03-16 ENCOUNTER — Telehealth: Payer: Self-pay | Admitting: Podiatry

## 2022-03-16 NOTE — Telephone Encounter (Signed)
Pt was calling asking about some recommendations for orthotics being that Medicare does not cover them. He stated that Dr. Logan Bores wrote something on a piece of paper and gave it to the Pedorthist but is not sure that it was. Patient Basically would like to know what he can be prescribed.

## 2022-03-19 NOTE — Telephone Encounter (Signed)
Spoke with patient on the phone.  He is going to pursue custom orthotics from a Pedorthist in [place] apparently, referral from a friend.  Return to clinic as needed.  Thanks, Dr. Logan Bores

## 2022-03-28 ENCOUNTER — Ambulatory Visit: Payer: Medicare Other | Admitting: Cardiology

## 2022-04-27 ENCOUNTER — Ambulatory Visit: Payer: Medicare Other | Attending: Cardiology | Admitting: Cardiology

## 2022-04-27 ENCOUNTER — Encounter: Payer: Self-pay | Admitting: Cardiology

## 2022-04-27 VITALS — BP 110/62 | HR 57 | Ht 71.0 in | Wt 206.4 lb

## 2022-04-27 DIAGNOSIS — I4510 Unspecified right bundle-branch block: Secondary | ICD-10-CM

## 2022-04-27 DIAGNOSIS — G4733 Obstructive sleep apnea (adult) (pediatric): Secondary | ICD-10-CM

## 2022-04-27 DIAGNOSIS — E785 Hyperlipidemia, unspecified: Secondary | ICD-10-CM | POA: Diagnosis not present

## 2022-04-27 DIAGNOSIS — I10 Essential (primary) hypertension: Secondary | ICD-10-CM | POA: Diagnosis not present

## 2022-04-27 DIAGNOSIS — R0609 Other forms of dyspnea: Secondary | ICD-10-CM

## 2022-04-27 NOTE — Patient Instructions (Addendum)
Medication Instructions:  Your physician recommends that you continue on your current medications as directed. Please refer to the Current Medication list given to you today.  *If you need a refill on your cardiac medications before your next appointment, please call your pharmacy*   Lab Work: None Ordered If you have labs (blood work) drawn today and your tests are completely normal, you will receive your results only by: MyChart Message (if you have MyChart) OR A paper copy in the mail If you have any lab test that is abnormal or we need to change your treatment, we will call you to review the results.   Testing/Procedures: Your physician has requested that you have an echocardiogram. Echocardiography is a painless test that uses sound waves to create images of your heart. It provides your doctor with information about the size and shape of your heart and how well your heart's chambers and valves are working. This procedure takes approximately one hour. There are no restrictions for this procedure.    Follow-Up: At [facility], you and your health needs are our priority.  As part of our continuing mission to provide you with exceptional heart care, we have created designated Provider Care Teams.  These Care Teams include your primary Cardiologist (physician) and Advanced Practice Providers (APPs -  Physician Assistants and Nurse Practitioners) who all work together to provide you with the care you need, when you need it.  We recommend signing up for the patient portal called "MyChart".  Sign up information is provided on this After Visit Summary.  MyChart is used to connect with patients for Virtual Visits (Telemedicine).  Patients are able to view lab/test results, encounter notes, upcoming appointments, etc.  Non-urgent messages can be sent to your provider as well.   To learn more about what you can do with MyChart, go to [email or web address].    Your next appointment:   12  month(s)  The format for your next appointment:   In Person  Provider:   Robert Krasowski, MD    Other Instructions NA  

## 2022-04-27 NOTE — Progress Notes (Unsigned)
Cardiology Office Note:    Date:  04/27/2022   ID:  Alfred Stephenson, DOB 15-Apr-1951, MRN [id number]  PCP:  Alfred Fillers, MD  Cardiologist:  Alfred Balsam, MD    Referring MD: Alfred Fillers, MD   Chief Complaint  Patient presents with   Follow-up  Doing well  History of Present Illness:    Alfred Stephenson is a 71 y.o. male with past medical history significant for sarcoidosis, obstructive sleep apnea, essential hypertension, dyslipidemia, frequent supraventricular ectopy.  He comes today to my office for follow-up.  Overall he is doing very well.  He denies have any chest pain, tightness, pressure, burning in the chest.  Doing very well try to be active.  Really recently he did have neck surgery and is very happy and satisfied with results of it.  Past Medical History:  Diagnosis Date   Anxiety    Arthritis    arthritis left index finger    Chronic kidney disease    Hyperlipidemia    Hypertension    Sarcoidosis    Sleep apnea    cpap does not know settings, 02/04/18 BiPAP    Past Surgical History:  Procedure Laterality Date   BACK SURGERY  2007   CARDIAC CATHETERIZATION     GREEN LIGHT LASER TURP (TRANSURETHRAL RESECTION OF PROSTATE  08/22/2012   Procedure: GREEN LIGHT LASER TURP (TRANSURETHRAL RESECTION OF PROSTATE;  Surgeon: Alfred Haste, MD;  Location: [facility];  Service: Urology;  Laterality: N/A;       LUNG BIOPSY  1970   NASAL SINUS SURGERY  2006   NECK SURGERY  2007    Current Medications: Current Meds  Medication Sig   amLODipine (NORVASC) 10 MG tablet Take 10 mg by mouth daily.    aspirin EC 81 MG tablet Take 81 mg by mouth daily.   metoprolol tartrate (LOPRESSOR) 25 MG tablet Take 1 tablet (25 mg total) by mouth 2 (two) times daily.   pravastatin (PRAVACHOL) 40 MG tablet Take 40 mg by mouth daily.   PRESCRIPTION MEDICATION Take 4 mg by mouth every evening. Silodojin  take at  8pm   PRISTIQ 100 MG 24 hr tablet Take 100 mg by mouth every  morning.    telmisartan (MICARDIS) 80 MG tablet Take 80 mg by mouth daily.      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[employer]  Tobacco Use   Smoking status: Never    Passive exposure: Never   Smokeless tobacco: Never  Vaping Use   Vaping Use: Never used  Substance and Sexual Activity   Alcohol use: No   Drug use: No   Sexual activity: Not on file  Other Topics Concern   Not on file  Social History Narrative   Not on file   Social Determinants of Health   Financial Resource Strain: Not on file  Food Insecurity: Not on file  Transportation Needs: Not on file  Physical Activity: Not on file  Stress: Not on file  Social Connections: Not on file     Family History: The patient's family history includes CAD in his father; Heart disease in his father; Stroke in his father. ROS:   Please see the history  of present illness.    All 14 point review of systems negative except as described per history of present illness  EKGs/Labs/Other Studies Reviewed:      Recent Labs: No results found for requested labs within last 365 days.  Recent Lipid Panel No results found for: "CHOL", "TRIG", "HDL", "CHOLHDL", "VLDL", "LDLCALC", "LDLDIRECT"  Physical Exam:    VS:  BP 110/62 (BP Location: Left Arm, Patient Position: Sitting)   Pulse (!) 57   Ht 5\' 11"  (1.803 m)   Wt 206 lb 6.4 oz (93.6 kg)   SpO2 99%   BMI 28.79 kg/m     Wt Readings from Last 3 Encounters:  04/27/22 206 lb 6.4 oz (93.6 kg)  09/28/21 214 lb 1.3 oz (97.1 kg)  09/13/21 223 lb (101.2 kg)     GEN:  Well nourished, well developed in no acute distress HEENT: Normal NECK: No JVD; No carotid bruits LYMPHATICS: No lymphadenopathy CARDIAC: RRR, no murmurs, no rubs, no  gallops RESPIRATORY:  Clear to auscultation without rales, wheezing or rhonchi  ABDOMEN: Soft, non-tender, non-distended MUSCULOSKELETAL:  No edema; No deformity  SKIN: Warm and dry LOWER EXTREMITIES: no swelling NEUROLOGIC:  Alert and oriented x 3 PSYCHIATRIC:  Normal affect   ASSESSMENT:    1. Right bundle branch block   2. Essential hypertension   3. Obstructive sleep apnea treated with BiPAP   4. Dyslipidemia    PLAN:    In order of problems listed above:  Right bundle branch block chronic noted continue present management Primary hypertension blood pressure well controlled continue present management Dyslipidemia I did review K PN he is on Pravachol 40 he is LDL 95 HDL 36 acceptable numbers continue present management Frequent supraventricular ectopy.  I will ask him to have an echocardiogram to assess left ventricle ejection fraction.  He is taking small dose of beta-blocker with no difficulty tolerating it.   Medication Adjustments/Labs and Tests Ordered: Current medicines are reviewed at length with the patient today.  Concerns regarding medicines are outlined above.  No orders of the defined types were placed in this encounter.  Medication changes: No orders of the defined types were placed in this encounter.   Signed, Park Liter, MD, [facility] 04/27/2022 10:20 AM    [place]

## 2022-05-11 ENCOUNTER — Ambulatory Visit: Payer: Medicare Other | Attending: Cardiology

## 2022-05-24 ENCOUNTER — Ambulatory Visit: Payer: Medicare Other | Attending: Cardiology

## 2022-05-24 DIAGNOSIS — R0609 Other forms of dyspnea: Secondary | ICD-10-CM | POA: Diagnosis not present

## 2022-05-24 LAB — ECHOCARDIOGRAM COMPLETE
Area-P 1/2: 2.87 cm2
S' Lateral: 2.7 cm

## 2022-05-28 ENCOUNTER — Telehealth: Payer: Self-pay

## 2022-05-28 NOTE — Telephone Encounter (Signed)
Results reviewed with pt's spouse as per Dr. Krasowski's note.  Pt's spouse verbalized understanding and had no additional questions. Routed to PCP  

## 2022-06-06 NOTE — Progress Notes (Addendum)
PATIENT: Alfred Stephenson DOB: 03/09/1951  REASON FOR VISIT: follow up HISTORY FROM: patient  Chief Complaint  Patient presents with   New Patient (Initial Visit)    Pt in room #11 and alone. Pt here today for f/u with her OSA on BiPAP.     HISTORY OF PRESENT ILLNESS:  08/21/22 ALL: Alfred Stephenson returns for follow up for OSA on CPAP. He was last seen 12/2019. He reports doing well. He is using BiPAP most every night. He does report having to adjust his nasal pillow mask multiple times during the night. He also has some drooling. He is sleeping well. His machine was set up in 2018.     12/22/2019 ALL: Alfred Stephenson is a 71 y.o. male here today for follow up for OSA on BiPAP. He reports that he is doing well on BiPAP therapy.  He is using BiPAP every night.  He is currently using a nasal pillow and is curious if a full facemask might be a better fit for him.  He has had some difficulty with the nasal pillows slipping on his face.  He denies any other concerns today.  Compliance report dated 11/21/2019 through 12/20/2019 reveals that he used CPAP 29 of the last 30 days for compliance of 97%.  He used CPAP greater than 4 hours 27 of the past 90 days for compliance of 90%.  Average usage was 7 hours and 28 minutes.  Residual AHI was 2.6 with IPAP of 9 cm of water and EPAP of 5 cm of water.  There was no significant leak noted.   HISTORY: (copied from [employer] note on 06/11/2018)  PDATE 11/6/2019[name] 71 year old male returns for follow-up for BiPAP compliance.  His daytime drowsiness is much improved he has nasal pillows for mask.  Compliance data dated 05/11/2018-06/09/2018 shows compliance greater than 4 hours at 97%.  Average usage 7 hours 45 minutes.  Pressure 5 to 9 cm.  Leak 95th percentile 0.9.  AHI 2.6.  ESS 6 FSS 15. He returns for reevaluation     UPDATE 7/2/19[name], 71 year old male returns for follow-up for initial BiPAP compliance.  He does complain with leak.   ESS 3 and his daytime drowsiness has improved.  Data dated 01/04/2018-01/06/2018 shows compliance greater than 4 hours at 100 percent.  Average usage 8 hours 37 minutes.  Pressure 5 to 9 cm leak 38.2%.  AHI 6.6.  He returns for reevaluation obstructive sleep apnea with BiPAP    11/12/18CDGeorge E Stephenson is a 71 y.o. male , seen here as in a referral ( prolonged interval RV ) from Dr. Philip Aspen  to be re- evaluated for OSA.   I have the pleasure of meeting with Alfred Stephenson today on 17 June 2017, the patient is an established former sleep lab patient of Dr Annamaria Boots  's  who was evaluated in the year 2007, he had his last study in 2011 with [facility]- he was placed on CPAP, but he did not stay in contact.  He also did use his CPAP for a while and then discontinued after about for 5 years. Dr. Sharlett Iles attached a copy of the sleep study from 10 November 2009, the study was performed as a split-night protocol.  The patient was diagnosed with 119 hypopneas, 18 apneas resulting in an AHI of 60.7, no REM sleep was noted, and oxygen nadir was 86% with only 1.4 minutes of desaturation, he was placed on CPAP but developed  central apneas.  There was also a strong supine AHI increase.  We decided to start him on CPAP of 8 cm which had reduced the AHI to 4.0.  He also had a high PLM arousal index was not aware of restless legs at the time.   In the meantime he had a broken nasal septum;  this was repaired in 2007, he also had an posterior neck plate / neck fusion surgery, 2016 he had a TURP,  He carries the diagnosis of sarcoidosis,  and his chief complaint today is that he is sleeping too much, sometimes short of breath, fatigue and daytime with a decreased level of energy - in the meantime restless legs have emerged. HTN, Hyperlipidemia. CKD 3. OSA, untreated.    Sleep habits are as follows:  His bedtime has been later than in his working years and is now around 10 PM, when he retreats to his bedroom the bedroom  is cool, and dark.  Watches TV news in the bedroom however until midnight.  His wife comes into the bedroom later and turns the TV off again.  He is usually on his back while watching TV during the night.  Which to a lateral.  He sleeps on only one pillow. His wife sleeps with an oxygen concentrator.  He may have 1 or 2 times nocturia each night, and he usually can go right back to sleep.  He does sometimes have vivid dreams but he is not known to [place] at night he is not a sleep walker either.  He usually arises in the morning at whenever he feels like it.  Most likely between 10 and 11 AM. The patient estimates that he sleeps more than 10 hours at night, and he may still sleep feel sleepy in the daytime.  This has concerned his wife greatly, he endorsed the Epworth sleepiness score at 8 points, the fatigue severity score of 27 points in the geriatric depression score at only 2 out of 15 points.   REVIEW OF SYSTEMS: Out of a complete 14 system review of symptoms, the patient complains only of the following symptoms, none and all other reviewed systems are negative.  ESS: 8/24, previously 11/24 FSS: 23  ALLERGIES: Allergies  Allergen Reactions   Lisinopril Other (See Comments)    HOME MEDICATIONS: Outpatient Medications Prior to Visit  Medication Sig Dispense Refill   amLODipine (NORVASC) 10 MG tablet Take 10 mg by mouth daily.      aspirin EC 81 MG tablet Take 81 mg by mouth daily.     metoprolol tartrate (LOPRESSOR) 25 MG tablet Take 1 tablet (25 mg total) by mouth 2 (two) times daily. 180 tablet 2   pravastatin (PRAVACHOL) 40 MG tablet Take 40 mg by mouth daily.     PRESCRIPTION MEDICATION Take 4 mg by mouth every evening. Silodojin  take at  8pm     PRISTIQ 100 MG 24 hr tablet Take 100 mg by mouth every morning.      telmisartan (MICARDIS) 80 MG tablet Take 80 mg by mouth daily.      No facility-administered medications prior to visit.    PAST MEDICAL HISTORY: Past Medical History:   Diagnosis Date   Anxiety    Arthritis    arthritis left index finger    Chronic kidney disease    Hyperlipidemia    Hypertension    Sarcoidosis    Sleep apnea    cpap does not know settings, 02/04/18 BiPAP    PAST  SURGICAL HISTORY: Past Surgical History:  Procedure Laterality Date   BACK SURGERY  2007   CARDIAC CATHETERIZATION     GREEN LIGHT LASER TURP (TRANSURETHRAL RESECTION OF PROSTATE  08/22/2012   Procedure: GREEN LIGHT LASER TURP (TRANSURETHRAL RESECTION OF PROSTATE;  Surgeon: Antony Haste, MD;  Location: [facility];  Service: Urology;  Laterality: N/A;       LUNG BIOPSY  1970   NASAL SINUS SURGERY  2006   NECK SURGERY  2007    FAMILY HISTORY: Family History  Problem Relation Age of Onset   Heart disease Father    Stroke Father    CAD Father     SOCIAL HISTORY: Social History   Socioeconomic History   Marital status: Married    Spouse name: Olegario Messier   Number of children: Not on file   Years of education: Not on file   Highest education level: Not on file  Occupational History   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[employer]  Tobacco Use   Smoking status: Never    Passive exposure: Never   Smokeless tobacco: Never  Vaping Use   Vaping Use: Never used  Substance and Sexual Activity   Alcohol use: No   Drug use: No   Sexual activity: Not on file  Other Topics Concern   Not on file  Social History Narrative   Not on file   Social Determinants of Health   Financial Resource Strain: Not on file  Food Insecurity: Not on file  Transportation Needs: Not on file  Physical Activity: Not on file  Stress: Not on file  Social Connections: Not on file  Intimate Partner Violence: Not on file      PHYSICAL EXAM  Vitals:   06/11/22 0958  BP: 122/74  Pulse: (!) 59  Weight: 206 lb 8 oz (93.7 kg)  Height: 5\' 11"  (1.803 m)    Body mass index is 28.8 kg/m.  Neck circ: 17"  Generalized: Well developed, in no acute distress  Cardiology: normal rate and  rhythm, no murmur noted Respiratory: clear to auscultation bilaterally  Neurological examination  Mentation: Alert oriented to time, place, history taking. Follows all commands speech and language fluent Cranial nerve II-XII: Pupils were equal round reactive to light. Extraocular movements were full, visual field were full  Motor: The motor testing reveals 5 over 5 strength of all 4 extremities. Good symmetric motor tone is noted throughout.  Gait and station: Gait is normal.   DIAGNOSTIC DATA (LABS, IMAGING, TESTING) - I reviewed patient records, labs, notes, testing and imaging myself where available.      No data to display           Lab Results  Component Value Date   WBC 6.9 08/12/2019   HGB 15.9 08/12/2019   HCT 48.0 08/12/2019   MCV 93.0 08/12/2019   PLT 234 08/12/2019      Component Value Date/Time   NA 136 08/12/2019 2110   NA 137 07/21/2018 1134   K 4.8 08/12/2019 2110   CL 103 08/12/2019 2110   CO2 22 08/12/2019 2110   GLUCOSE 95 08/12/2019 2110   BUN 33 (H) 08/12/2019 2110   BUN 21 07/21/2018 1134   CREATININE 1.66 (H) 08/12/2019 2110   CALCIUM 9.0 08/12/2019 2110   PROT 7.6 08/12/2019 2110   ALBUMIN 4.1 08/12/2019 2110   AST 21 08/12/2019 2110   ALT 15 08/12/2019 2110   ALKPHOS 95 08/12/2019 2110   BILITOT 0.3 08/12/2019 2110   GFRNONAA 41 (L)  08/12/2019 2110   GFRAA 48 (L) 08/12/2019 2110   No results found for: "CHOL", "HDL", "LDLCALC", "LDLDIRECT", "TRIG", "CHOLHDL" No results found for: "HGBA1C" No results found for: "VITAMINB12"      ASSESSMENT AND PLAN 71 y.o. year old male  has a past medical history of Anxiety, Arthritis, Chronic kidney disease, Hyperlipidemia, Hypertension, Sarcoidosis, and Sleep apnea. here with     ICD-10-CM   1. OSA treated with BiPAP  G47.33 For home use only DME continuous positive airway pressure (CPAP)    Home sleep test       Alfred. Krolak is doing very well with BiPAP therapy at home.  Compliance report  reveals excellent compliance.  He was encouraged to continue using CPAP nightly and for greater than 4 hours each night.  He does request to have a mask refitting as he is interested in trying a full facemask.  Orders for mask refitting and supplies have been placed today. I have also placed orders for repeat HST as his current machine is 73 years old. He will continue close follow-up with primary care.  Start habits with well-balanced diet, regular exercise and adequate hydration discussed.  He will follow-up with me within 60-90 days of receiving new BiPAP machine.  He verbalizes understanding and agreement with this plan.   Orders Placed This Encounter  Procedures   For home use only DME continuous positive airway pressure (CPAP)    Mask refitting please. Having to readjust nasal pillow often throughout night, reports drooling at night.    Order Specific Question:   Length of Need    Answer:   Lifetime    Order Specific Question:   Patient has OSA or probable OSA    Answer:   Yes    Order Specific Question:   Is the patient currently using CPAP in the home    Answer:   Yes    Order Specific Question:   Settings    Answer:   Other see comments    Order Specific Question:   CPAP supplies needed    Answer:   Mask, headgear, cushions, filters, heated tubing and water chamber   Home sleep test    Patient eligible for new BiPAP machine, currently well treated at current settings.    Standing Status:   Future    Number of Occurrences:   1    Standing Expiration Date:   06/12/2023    Order Specific Question:   Where should this test be performed:    Answer:   [facility]     No orders of the defined types were placed in this encounter.     Shawnie Dapper, FNP-C 08/21/2022, 9:47 AM [facility] [address] [place], [place] [address] [phone]

## 2022-06-06 NOTE — Patient Instructions (Addendum)
Please continue using your CPAP regularly. While your insurance requires that you use CPAP at least 4 hours each night on 70% of the nights, I recommend, that you not skip any nights and use it throughout the night if you can. Getting used to CPAP and staying with the treatment long term does take time and patience and discipline. Untreated obstructive sleep apnea when it is moderate to severe can have an adverse impact on cardiovascular health and raise her risk for heart disease, arrhythmias, hypertension, congestive heart failure, stroke and diabetes. Untreated obstructive sleep apnea causes sleep disruption, nonrestorative sleep, and sleep deprivation. This can have an impact on your day to day functioning and cause daytime sleepiness and impairment of cognitive function, memory loss, mood disturbance, and problems focussing. Using CPAP regularly can improve these symptoms.   Listen out for a call from our sleep lab to schedule sleep study.   I have sent mask refitting orders to your DME. Please reach out to discuss mask options.   Follow up with me within 60-90 days after getting your new BiPAP machine.

## 2022-06-11 ENCOUNTER — Ambulatory Visit (INDEPENDENT_AMBULATORY_CARE_PROVIDER_SITE_OTHER): Payer: Medicare Other | Admitting: Family Medicine

## 2022-06-11 ENCOUNTER — Encounter: Payer: Self-pay | Admitting: Family Medicine

## 2022-06-11 VITALS — BP 122/74 | HR 59 | Ht 71.0 in | Wt 206.5 lb

## 2022-06-11 DIAGNOSIS — G4733 Obstructive sleep apnea (adult) (pediatric): Secondary | ICD-10-CM

## 2022-06-12 ENCOUNTER — Telehealth: Payer: Self-pay

## 2022-06-21 ENCOUNTER — Telehealth: Payer: Self-pay | Admitting: Neurology

## 2022-06-21 NOTE — Telephone Encounter (Signed)
I spoke with the patient wife.  HST- [employer] medicare no auth req   He is scheduled at [facility] for 07/17/22 at 9 AM.  Mailed packet to the patient.

## 2022-06-26 DIAGNOSIS — R310 Gross hematuria: Secondary | ICD-10-CM | POA: Insufficient documentation

## 2022-07-17 NOTE — Telephone Encounter (Signed)
Patient r/s for 08/14/22 at 8:30 AM.

## 2022-08-14 ENCOUNTER — Ambulatory Visit (INDEPENDENT_AMBULATORY_CARE_PROVIDER_SITE_OTHER): Payer: Medicare Other | Admitting: Neurology

## 2022-08-14 DIAGNOSIS — G4733 Obstructive sleep apnea (adult) (pediatric): Secondary | ICD-10-CM

## 2022-08-16 NOTE — Progress Notes (Signed)
See procedure note.

## 2022-08-17 NOTE — Procedures (Signed)
[facility]  HOME SLEEP TEST (Watch PAT) REPORT  STUDY DATE: 08/14/22  DOB: 02-14-1951  MRN: [id number]  ORDERING CLINICIAN: Star Age, MD, PhD - study interpreted on behalf of Dr. Brett Fairy   REFERRING CLINICIAN: Donnajean Lopes, MD (PCP), Amy Lomax, NP/Dr. Dohmeier (sleep)  CLINICAL INFORMATION/HISTORY: 72 year old male with an underlying medical history of hypertension, hyperlipidemia, arthritis, palpitations, anxiety, hearing loss, allergic rhinitis, who presents for evaluation of his obstructive sleep apnea.  He has been on standard BiPAP therapy with excellent compliance, pressure of 9/5 with good apnea control.  He should be eligible for new equipment.  Epworth sleepiness score: 8/24.  BMI: 29 kg/m  FINDINGS:   Sleep Summary:   Total Recording Time (hours, min): 7 hours, 46 min  Total Sleep Time (hours, min):  6 hours, 2 min  Percent REM (%):    13.5%   Respiratory Indices:   Calculated pAHI (per hour):  10.1/hour         REM pAHI:    13.7/hour       NREM pAHI: 9.5/hour  Central pAHI: 2.2/hour  Oxygen Saturation Statistics:    Oxygen Saturation (%) Mean: 96%   Minimum oxygen saturation (%):                 82%   O2 Saturation Range (%): 82 - 100%    O2 Saturation (minutes) <=88%: 0.2 min  Pulse Rate Statistics:   Pulse Mean (bpm):    54/min    Pulse Range (30 - 82/min)   IMPRESSION: OSA (obstructive sleep apnea)   RECOMMENDATION:  This home sleep test demonstrates overall mild obstructive sleep apnea with a total AHI of 10.1/hour and O2 nadir of 82%. Snoring was detected, fairly consistently in the moderate range, at times milder, rarely louder.  This patient has been compliant with standard BiPAP therapy at a pressure of 9/5 cm with good apnea control.  He should be eligible for new equipment and I recommend a new BiPAP machine maintaining the current settings, mask of choice, sized to fit. A full night, in-lab PAP titration  study may aid in improving proper treatment settings and with mask fit, if needed, down the road. Alternative treatments may include weight loss (where appropriate) along with avoidance of the supine sleep position (if possible), or an oral appliance in appropriate candidates.   Please note that untreated obstructive sleep apnea may carry additional perioperative morbidity. Patients with significant obstructive sleep apnea should receive perioperative PAP therapy and the surgeons and particularly the anesthesiologist should be informed of the diagnosis and the severity of the sleep disordered breathing. The patient should be cautioned not to drive, work at heights, or operate dangerous or heavy equipment when tired or sleepy. Review and reiteration of good sleep hygiene measures should be pursued with any patient. Other causes of the patient's symptoms, including circadian rhythm disturbances, an underlying mood disorder, medication effect and/or an underlying medical problem cannot be ruled out based on this test. Clinical correlation is recommended.  The patient and his referring provider will be notified of the test results. The patient will be seen in follow up in sleep clinic at [facility], as necessary.  I certify that I have reviewed the raw data recording prior to the issuance of this report in accordance with the standards of the American Academy of Sleep Medicine (AASM).  INTERPRETING PHYSICIAN:   Star Age, MD, PhD Medical Director, [facility]) [address], ABPN (Neurology and Sleep)  [facility] [address], [place],  [address] [phone]

## 2022-08-21 ENCOUNTER — Telehealth: Payer: Self-pay

## 2022-08-21 ENCOUNTER — Other Ambulatory Visit: Payer: Self-pay | Admitting: Family Medicine

## 2022-08-21 DIAGNOSIS — G4733 Obstructive sleep apnea (adult) (pediatric): Secondary | ICD-10-CM

## 2022-08-21 NOTE — Telephone Encounter (Signed)
I spoke with the patient's wife, Curt Bears (as per [facility]). I informed her of the results of the sleep study and relayed the message regarding the new BiPAP. She will inform the patient of the message. Once the new machine is set up he will call the office back to schedule an appointment.

## 2022-08-21 NOTE — Telephone Encounter (Signed)
-----  Message from Debbora Presto, NP sent at 08/21/2022  9:44 AM EST ----- Let Alfred Stephenson know that his sleep study did show continued sleep apnea. I am placing orders for a new BiPAP. He will need follow up within 61-90 days following set up of new machine. TY!

## 2022-10-09 ENCOUNTER — Telehealth: Payer: Self-pay | Admitting: Family Medicine

## 2022-10-09 NOTE — Telephone Encounter (Signed)
Pt asking to be sent to another DME company for CPAP machine. Was not satisfied with [facility]. Would like order to be sent to [facility]. Would like a call back

## 2022-10-09 NOTE — Telephone Encounter (Signed)
I called and discussed the below with patient. Pt states the current DME is difficult to deal. Pt said she would prefer to use another DME, I explained that [facility] and [facility] are under same umbrella. Pt said any DME would be fine. Community message sent to  [facility] for new bipap machine per 08/2022 sleep study results.

## 2022-10-29 ENCOUNTER — Other Ambulatory Visit: Payer: Self-pay | Admitting: Cardiology

## 2022-12-03 ENCOUNTER — Ambulatory Visit (INDEPENDENT_AMBULATORY_CARE_PROVIDER_SITE_OTHER): Payer: Medicare Other | Admitting: Podiatry

## 2022-12-03 DIAGNOSIS — M722 Plantar fascial fibromatosis: Secondary | ICD-10-CM

## 2022-12-03 NOTE — Progress Notes (Signed)
Chief Complaint  Patient presents with   Nail Problem    Patient came in today for right hallux nail fungus, and bilateral heel pain follow-up, orthotics, patient denies any pain    HPI: 72 y.o. male presenting today for follow-up evaluation of bilateral heel pain.  Patient states that he never was able to get his orthotics last year.  He said that there was an issue with the pedorthist who worked at our office at the time.  He continues to have some slight sensitivity to the heels especially with activity  Patient also states that a few years ago he injured his right great toenail.  He says that the nail is now disfigured with dark discoloration.  Nontender.  He would like to have it evaluated  Past Medical History:  Diagnosis Date   Anxiety    Arthritis    arthritis left index finger    Chronic kidney disease    Hyperlipidemia    Hypertension    Sarcoidosis    Sleep apnea    cpap does not know settings, 02/04/18 BiPAP    Past Surgical History:  Procedure Laterality Date   BACK SURGERY  2007   CARDIAC CATHETERIZATION     GREEN LIGHT LASER TURP (TRANSURETHRAL RESECTION OF PROSTATE  08/22/2012   Procedure: GREEN LIGHT LASER TURP (TRANSURETHRAL RESECTION OF PROSTATE;  Surgeon: Antony Haste, MD;  Location: [facility];  Service: Urology;  Laterality: N/A;       LUNG BIOPSY  1970   NASAL SINUS SURGERY  2006   NECK SURGERY  2007    Allergies  Allergen Reactions   Lisinopril Other (See Comments)     Physical Exam: General: The patient is alert and oriented x3 in no acute distress.  Dermatology: Skin is warm, dry and supple bilateral lower extremities.  Hyperkeratotic dystrophic nail noted to the right hallux nail plate with dried subungual blood/discoloration to the nail plate which is consistent with patient's given history of injury  Vascular: Palpable pedal pulses bilaterally. Capillary refill within normal limits.  No appreciable edema.  No  erythema.  Neurological: Grossly intact via light touch  Musculoskeletal Exam: No pedal deformities noted  Assessment/Plan of Care: 1.  Plantar fasciitis bilateral -Patient evaluated. -OTC power step insoles were provided for the patient to wear daily. -Advised against going barefoot -Appointment for custom molded orthotics.  If the OTC power step insoles do help alleviate a lot of the patient's symptoms I would advise against custom molded orthotics and just continue the prefabricated insoles  2.  Dystrophic toenail right hallux nail plate secondary to trauma -A rotary Dremel and bur was utilized to smooth down the contour of the nail plate as well as remove the majority of the subungual dried sanguinous bleeding.  Performed without incident.  Healthy underlying nail and subungual tissue was noted.  There is no open wound -Recommend good supportive shoes and sneakers -Return to clinic as needed       Felecia Shelling, DPM [facility]  Dr. Felecia Shelling, DPM    [address] [place], [place] [address]                Office [phone]  Fax [phone]

## 2022-12-19 ENCOUNTER — Other Ambulatory Visit: Payer: Medicare Other

## 2022-12-27 ENCOUNTER — Ambulatory Visit (INDEPENDENT_AMBULATORY_CARE_PROVIDER_SITE_OTHER): Payer: Medicare Other

## 2022-12-27 DIAGNOSIS — M722 Plantar fascial fibromatosis: Secondary | ICD-10-CM

## 2022-12-27 NOTE — Progress Notes (Signed)
Patient presents today to be casted for custom molded orthotics. EVANS is the treating physician.  Impression scan cast was taken. ABN signed.  Patient info-  Shoe size: 13   Shoe style: ALL STYLES  Height: 5FT 11IN  Weight: 200  Insurance: [facility] MEDICARE   Patient will be notified once orthotics arrive in office and reappoint for fitting at that time.

## 2023-01-07 DIAGNOSIS — Z9289 Personal history of other medical treatment: Secondary | ICD-10-CM | POA: Insufficient documentation

## 2023-01-09 DIAGNOSIS — C61 Malignant neoplasm of prostate: Secondary | ICD-10-CM | POA: Insufficient documentation

## 2023-01-10 DIAGNOSIS — K5790 Diverticulosis of intestine, part unspecified, without perforation or abscess without bleeding: Secondary | ICD-10-CM | POA: Insufficient documentation

## 2023-01-16 ENCOUNTER — Ambulatory Visit (INDEPENDENT_AMBULATORY_CARE_PROVIDER_SITE_OTHER): Payer: Medicare Other | Admitting: Neurology

## 2023-01-16 ENCOUNTER — Telehealth: Payer: Self-pay | Admitting: Neurology

## 2023-01-16 ENCOUNTER — Encounter: Payer: Self-pay | Admitting: Neurology

## 2023-01-16 VITALS — BP 124/87 | HR 61 | Ht 71.0 in | Wt 203.6 lb

## 2023-01-16 DIAGNOSIS — R918 Other nonspecific abnormal finding of lung field: Secondary | ICD-10-CM | POA: Diagnosis not present

## 2023-01-16 DIAGNOSIS — G4733 Obstructive sleep apnea (adult) (pediatric): Secondary | ICD-10-CM

## 2023-01-16 DIAGNOSIS — I4510 Unspecified right bundle-branch block: Secondary | ICD-10-CM

## 2023-01-16 NOTE — Telephone Encounter (Signed)
Referral faxed to [facility] Phone [phone]  Fax: [phone]

## 2023-01-16 NOTE — Patient Instructions (Signed)
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
## 2023-01-28 ENCOUNTER — Other Ambulatory Visit: Payer: Medicare Other

## 2023-02-08 ENCOUNTER — Ambulatory Visit (INDEPENDENT_AMBULATORY_CARE_PROVIDER_SITE_OTHER): Payer: Medicare Other | Admitting: Podiatry

## 2023-02-08 DIAGNOSIS — M722 Plantar fascial fibromatosis: Secondary | ICD-10-CM | POA: Diagnosis not present

## 2023-02-08 NOTE — Progress Notes (Signed)
Patient presents today to pick up orthotics.  Patient was dispensed 1 pair of orthotics. Fit was satisfactory. Instructions for break-in and wear was reviewed and a copy was given to patient.  Re-appointment if they should experience any trouble with insoles.

## 2023-05-21 ENCOUNTER — Ambulatory Visit: Payer: Medicare Other | Attending: Cardiology | Admitting: Cardiology

## 2023-05-21 ENCOUNTER — Encounter: Payer: Self-pay | Admitting: Cardiology

## 2023-05-21 VITALS — BP 110/70 | HR 68 | Ht 71.0 in | Wt 201.2 lb

## 2023-05-21 DIAGNOSIS — R0789 Other chest pain: Secondary | ICD-10-CM

## 2023-05-21 DIAGNOSIS — G4733 Obstructive sleep apnea (adult) (pediatric): Secondary | ICD-10-CM

## 2023-05-21 DIAGNOSIS — I10 Essential (primary) hypertension: Secondary | ICD-10-CM

## 2023-05-21 DIAGNOSIS — I4510 Unspecified right bundle-branch block: Secondary | ICD-10-CM

## 2023-05-21 NOTE — Progress Notes (Signed)
Cardiology Office Note:    Date:  05/21/2023   ID:  Alfred Stephenson, DOB 29-Dec-1950, MRN [id number]  PCP:  Garlan Fillers, MD  Cardiologist:  Gypsy Balsam, MD    Referring MD: Garlan Fillers, MD   Chief Complaint  Patient presents with   Chest Pain    History of Present Illness:    Alfred Stephenson is a 72 y.o. male with past medical history significant for sarcoidosis, obstructive sleep apnea, essential hypertension, dyslipidemia, frequent supraventricular ectopy, right bundle branch block.  He comes today to months for follow-up he said week ago he had episode of chest pain he finished cutting the grass then come home sit down and started having tightness in the chest and tightness sensation lasted for about 5 minutes there was no sweating no shortness of breath associated with this sensation he never had it before he never had it after that.  Since that time he seems to be doing fine  Past Medical History:  Diagnosis Date   Anxiety    Arthritis    arthritis left index finger    Chronic kidney disease    Hyperlipidemia    Hypertension    Sarcoidosis    Sleep apnea    cpap does not know settings, 02/04/18 BiPAP    Past Surgical History:  Procedure Laterality Date   BACK SURGERY  2007   CARDIAC CATHETERIZATION     GREEN LIGHT LASER TURP (TRANSURETHRAL RESECTION OF PROSTATE  08/22/2012   Procedure: GREEN LIGHT LASER TURP (TRANSURETHRAL RESECTION OF PROSTATE;  Surgeon: Antony Haste, MD;  Location: [facility];  Service: Urology;  Laterality: N/A;       LUNG BIOPSY  1970   NASAL SINUS SURGERY  2006   NECK SURGERY  2007    Current Medications: Current Meds  Medication Sig   amLODipine (NORVASC) 10 MG tablet Take 10 mg by mouth daily.    metoprolol tartrate (LOPRESSOR) 25 MG tablet Take 1 tablet (25 mg total) by mouth 2 (two) times daily.   pravastatin (PRAVACHOL) 40 MG tablet Take 40 mg by mouth daily.   PRISTIQ 100 MG 24 hr tablet Take 100 mg by mouth  every morning.    telmisartan (MICARDIS) 80 MG tablet Take 80 mg by mouth daily.    [DISCONTINUED] aspirin EC 81 MG tablet Take 81 mg by mouth daily.   [DISCONTINUED] PRESCRIPTION MEDICATION Take 4 mg by mouth every evening. 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[employer]  Tobacco Use   Smoking status: Never    Passive exposure: Never   Smokeless tobacco: Never  Vaping Use   Vaping status: Never Used  Substance and Sexual Activity   Alcohol use: No   Drug use: No   Sexual activity: Not on file  Other Topics Concern   Not on file  Social History Narrative   Not on file   Social Determinants of Health   Financial Resource Strain: Not on file  Food Insecurity: Low Risk  (11/13/2022)   [facility]   Hunger Vital Sign    Worried About Running Out of Food in the Last Year:  Never true    Ran Out of Food in the Last Year: Never true  Transportation Needs: No Transportation Needs (11/13/2022)   [facility]   Transportation    In the past 12 months, has lack of reliable transportation kept you from medical appointments, meetings, work or from getting things needed for daily living? : No  Physical Activity: Not on file  Stress: Not on file  Social Connections: Not on file     Family History: The patient's family history includes CAD in his father; Heart disease in his father; Stroke in his father. ROS:   Please see the history of present illness.    All 14 point review of systems negative except as described per history of present illness  EKGs/Labs/Other Studies Reviewed:    EKG Interpretation Date/Time:  Tuesday May 21 2023 13:27:31 EDT Ventricular  Rate:  68 PR Interval:  208 QRS Duration:  118 QT Interval:  400 QTC Calculation: 425 R Axis:   35  Text Interpretation: Normal sinus rhythm Right bundle branch block Abnormal ECG When compared with ECG of 12-Aug-2019 20:55, PREVIOUS ECG IS PRESENT Confirmed by Gypsy Balsam [phone]) on 05/21/2023 1:57:08 PM    Recent Labs: No results found for requested labs within last 365 days.  Recent Lipid Panel No results found for: "CHOL", "TRIG", "HDL", "CHOLHDL", "VLDL", "LDLCALC", "LDLDIRECT"  Physical Exam:    VS:  BP 110/70 (BP Location: Left Arm, Patient Position: Sitting)   Pulse 68   Ht 5\' 11"  (1.803 m)   Wt 201 lb 3.2 oz (91.3 kg)   SpO2 95%   BMI 28.06 kg/m     Wt Readings from Last 3 Encounters:  05/21/23 201 lb 3.2 oz (91.3 kg)  01/16/23 203 lb 9.6 oz (92.4 kg)  06/11/22 206 lb 8 oz (93.7 kg)     GEN:  Well nourished, well developed in no acute distress HEENT: Normal NECK: No JVD; No carotid bruits LYMPHATICS: No lymphadenopathy CARDIAC: RRR, no murmurs, no rubs, no gallops RESPIRATORY:  Clear to auscultation without rales, wheezing or rhonchi  ABDOMEN: Soft, non-tender, non-distended MUSCULOSKELETAL:  No edema; No deformity  SKIN: Warm and dry LOWER EXTREMITIES: no swelling NEUROLOGIC:  Alert and oriented x 3 PSYCHIATRIC:  Normal affect   ASSESSMENT:    1. Essential hypertension   2. Atypical chest pain   3. Right bundle branch block   4. Obstructive sleep apnea    PLAN:    In order of problems listed above:  Chest pain concerning.  I will ask him to start taking 1 baby aspirin every single day, I will give him prescription for nitroglycerin to take it as needed, we will schedule him to have Lexiscan. Essential hypertension blood pressure well-controlled continue present management. Dyslipidemia I did review K PN which show me his data from last year with LDL of 95 HDL 36.  Will make arrangements to have cholesterol rechecked   Medication  Adjustments/Labs and Tests Ordered: Current medicines are reviewed at length with the patient today.  Concerns regarding medicines are outlined above.  Orders Placed This Encounter  Procedures   EKG 12-Lead   Medication changes: No orders of the defined types were placed in this encounter.   Signed, Georgeanna Lea, MD, [facility] 05/21/2023 2:05 PM    [facility]

## 2023-05-21 NOTE — Patient Instructions (Signed)
Medication Instructions:  Your physician recommends that you continue on your current medications as directed. Please refer to the Current Medication list given to you today.  *If you need a refill on your cardiac medications before your next appointment, please call your pharmacy*   Lab Work: None Ordered If you have labs (blood work) drawn today and your tests are completely normal, you will receive your results only by: MyChart Message (if you have MyChart) OR A paper copy in the mail If you have any lab test that is abnormal or we need to change your treatment, we will call you to review the results.   Testing/Procedures: Your physician has requested that you have a lexiscan myoview. For further information please visit [email or web address] Please follow instruction sheet, as given.  The test will take approximately 3 to 4 hours to complete; you may bring reading material.  If someone comes with you to your appointment, they will need to remain in the main lobby due to limited space in the testing area.   How to prepare for your Myocardial Perfusion Test: Do not eat or drink 3 hours prior to your test, except you may have water. Do not consume products containing caffeine (regular or decaffeinated) 12 hours prior to your test. (ex: coffee, chocolate, sodas, tea). Do bring a list of your current medications with you.  If not listed below, you may take your medications as normal. Do wear comfortable clothes (no dresses or overalls) and walking shoes, tennis shoes preferred (No heels or open toe shoes are allowed). Do NOT wear cologne, perfume, aftershave, or lotions (deodorant is allowed). If these instructions are not followed, your test will have to be rescheduled.     Follow-Up: At [facility], you and your health needs are our priority.  As part of our continuing mission to provide you with exceptional heart care, we have created designated Provider Care Teams.  These Care Teams  include your primary Cardiologist (physician) and Advanced Practice Providers (APPs -  Physician Assistants and Nurse Practitioners) who all work together to provide you with the care you need, when you need it.  We recommend signing up for the patient portal called "MyChart".  Sign up information is provided on this After Visit Summary.  MyChart is used to connect with patients for Virtual Visits (Telemedicine).  Patients are able to view lab/test results, encounter notes, upcoming appointments, etc.  Non-urgent messages can be sent to your provider as well.   To learn more about what you can do with MyChart, go to [email or web address].    Your next appointment:   12 month(s)  The format for your next appointment:   In Person  Provider:   Gypsy Balsam, MD    Other Instructions NA

## 2023-05-21 NOTE — Addendum Note (Signed)
Addended by: Baldo Ash D on: 05/21/2023 02:09 PM   Modules accepted: Orders

## 2023-05-24 ENCOUNTER — Ambulatory Visit: Payer: Medicare Other | Admitting: Cardiology

## 2023-05-28 NOTE — Addendum Note (Signed)
Addended by: Gypsy Balsam on: 05/28/2023 11:16 AM   Modules accepted: Orders

## 2023-06-05 ENCOUNTER — Telehealth (HOSPITAL_COMMUNITY): Payer: Self-pay | Admitting: *Deleted

## 2023-06-05 NOTE — Telephone Encounter (Signed)
Spoke with patient's wife and gave instructions for Alfred Stephenson Stress TEST on 06/12/23 at 7:45.

## 2023-06-12 ENCOUNTER — Ambulatory Visit: Payer: Medicare Other | Attending: Cardiology

## 2023-06-12 DIAGNOSIS — R0789 Other chest pain: Secondary | ICD-10-CM | POA: Diagnosis not present

## 2023-06-12 LAB — MYOCARDIAL PERFUSION IMAGING
LV dias vol: 69 mL (ref 62–150)
LV sys vol: 22 mL
Nuc Stress EF: 69 %
Peak HR: 85 {beats}/min
Rest HR: 65 {beats}/min
Rest Nuclear Isotope Dose: 8.8 mCi
SDS: 2
SRS: 5
SSS: 7
Stress Nuclear Isotope Dose: 27.2 mCi
TID: 1.03

## 2023-06-12 MED ORDER — TECHNETIUM TC 99M TETROFOSMIN IV KIT
8.8000 | PACK | Freq: Once | INTRAVENOUS | Status: AC | PRN
  Administered 2023-06-12: 8.8 via INTRAVENOUS

## 2023-06-12 MED ORDER — TECHNETIUM TC 99M TETROFOSMIN IV KIT
27.2000 | PACK | Freq: Once | INTRAVENOUS | Status: AC | PRN
  Administered 2023-06-12: 27.2 via INTRAVENOUS

## 2023-06-12 MED ORDER — REGADENOSON 0.4 MG/5ML IV SOLN
0.4000 mg | Freq: Once | INTRAVENOUS | Status: AC
Start: 2023-06-12 — End: 2023-06-12
  Administered 2023-06-12: 0.4 mg via INTRAVENOUS

## 2023-06-21 ENCOUNTER — Telehealth: Payer: Self-pay

## 2023-06-21 NOTE — Telephone Encounter (Signed)
-----   Message from Gypsy Balsam sent at 06/14/2023  9:41 AM EST ----- Stress test is normal

## 2023-06-21 NOTE — Telephone Encounter (Signed)
Spoke with [employer]), notified of results

## 2023-07-02 ENCOUNTER — Ambulatory Visit: Payer: Medicare Other

## 2023-07-02 NOTE — Progress Notes (Signed)
Plantar fill added to BIL foot orthotics  To help stiffen arch patient feels as they are too flexible  Will call if anything else is needed  Addison Bailey Cped, CFo, CFm

## 2023-07-28 ENCOUNTER — Other Ambulatory Visit: Payer: Self-pay | Admitting: Cardiology

## 2023-12-17 ENCOUNTER — Telehealth: Payer: Self-pay | Admitting: Neurology

## 2023-12-17 ENCOUNTER — Encounter: Payer: Self-pay | Admitting: Neurology

## 2023-12-17 NOTE — Telephone Encounter (Signed)
 LVM and sent letter in mail informing pt of need to reschedule 01/22/24 appt - MD out   The beginning of June with Dr. Albertina Hugger is held for reschedules if patient calls back

## 2024-01-16 ENCOUNTER — Ambulatory Visit: Payer: Medicare Other | Admitting: Neurology

## 2024-01-22 ENCOUNTER — Ambulatory Visit: Payer: Medicare Other | Admitting: Neurology

## 2024-06-10 ENCOUNTER — Ambulatory Visit: Attending: Cardiology | Admitting: Cardiology

## 2024-06-10 ENCOUNTER — Encounter: Payer: Self-pay | Admitting: Cardiology

## 2024-06-10 VITALS — BP 120/90 | HR 68 | Ht 71.0 in | Wt 192.0 lb

## 2024-06-10 DIAGNOSIS — G4733 Obstructive sleep apnea (adult) (pediatric): Secondary | ICD-10-CM | POA: Diagnosis not present

## 2024-06-10 DIAGNOSIS — R918 Other nonspecific abnormal finding of lung field: Secondary | ICD-10-CM | POA: Diagnosis not present

## 2024-06-10 DIAGNOSIS — I10 Essential (primary) hypertension: Secondary | ICD-10-CM

## 2024-06-10 DIAGNOSIS — E785 Hyperlipidemia, unspecified: Secondary | ICD-10-CM

## 2024-06-10 DIAGNOSIS — R0609 Other forms of dyspnea: Secondary | ICD-10-CM

## 2024-06-10 DIAGNOSIS — I4510 Unspecified right bundle-branch block: Secondary | ICD-10-CM

## 2024-06-10 MED ORDER — METOPROLOL TARTRATE 25 MG PO TABS: 25.0000 mg | ORAL_TABLET | Freq: Two times a day (BID) | ORAL | 3 refills | Status: AC

## 2024-06-10 NOTE — Progress Notes (Signed)
 Cardiology Office Note:    Date:  06/10/2024   ID:  Alfred Stephenson, DOB 07/31/1951, MRN [id number]  PCP:  Yolande Toribio MATSU, MD  Cardiologist:  Lamar Fitch, MD    Referring MD: Yolande Toribio MATSU, MD   Chief Complaint  Patient presents with   Annual Exam  Doing fine  History of Present Illness:    Alfred Stephenson is a 73 y.o. male past medical history significant for sarcoidosis, obstructive sleep apnea, essential hypertension, dyslipidemia, right bundle branch block.  Comes today to months for follow-up.  I see him quite often when he comes with his wife.  He is doing more or less okay described to have elevation more shortness of breath but denies of any chest pain tightness squeezing pressure mid chest no dizziness no passing out  Past Medical History:  Diagnosis Date   Anxiety    Arthritis    arthritis left index finger    Chronic kidney disease    Hyperlipidemia    Hypertension    Sarcoidosis    Sleep apnea    cpap does not know settings, 02/04/18 BiPAP    Past Surgical History:  Procedure Laterality Date   BACK SURGERY  2007   CARDIAC CATHETERIZATION     GREEN LIGHT LASER TURP (TRANSURETHRAL RESECTION OF PROSTATE  08/22/2012   Procedure: GREEN LIGHT LASER TURP (TRANSURETHRAL RESECTION OF PROSTATE;  Surgeon: Donnice Gwenyth Brooks, MD;  Location: [facility];  Service: Urology;  Laterality: N/A;       LUNG BIOPSY  1970   NASAL SINUS SURGERY  2006   NECK SURGERY  2007    Current Medications: Current Meds  Medication Sig   amLODipine  (NORVASC ) 5 MG tablet Take 5 mg by mouth daily.   aspirin  EC 81 MG tablet Take 81 mg by mouth daily. Swallow whole.   metoprolol  tartrate (LOPRESSOR ) 25 MG tablet TAKE 1 TABLET BY MOUTH 2 TIMES DAILY   oxybutynin (DITROPAN-XL) 10 MG 24 hr tablet Take 10 mg by mouth daily.   pravastatin (PRAVACHOL) 40 MG tablet Take 40 mg by mouth daily.   PRISTIQ 100 MG 24 hr tablet Take 100 mg by mouth every morning.    telmisartan (MICARDIS) 80  MG tablet Take 80 mg by mouth daily.      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[employer]  Tobacco Use   Smoking status: Never    Passive exposure: Never   Smokeless tobacco: Never  Vaping Use   Vaping status: Never Used  Substance and Sexual Activity   Alcohol use: No   Drug use: No   Sexual activity: Not on file  Other Topics Concern   Not on file  Social History Narrative   Not on file   Social Drivers of Health   Financial Resource Strain: Not on file  Food Insecurity: Low Risk  (11/13/2022)   Received from [facility]   Hunger Vital Sign    Within the past 12 months, you worried that your food would run out before you got money to buy more: Never true    Within the past 12 months, the food you bought just didn't last and you didn't have money to get more. : Never  true  Transportation Needs: No Transportation Needs (11/13/2022)   Received from [facility]    In the past 12 months, has lack of reliable transportation kept you from medical appointments, meetings, work or from getting things needed for daily living? : No  Physical Activity: Not on file  Stress: Not on file  Social Connections: Not on file     Family History: The patient's family history includes CAD in his father; Heart disease in his father; Stroke in his father. ROS:   Please see the history of present illness.    All 14 point review of systems negative except as described per history of present illness  EKGs/Labs/Other Studies Reviewed:         Recent Labs: No results found for requested labs within last 365 days.  Recent Lipid Panel No results found for: CHOL, TRIG, HDL, CHOLHDL, VLDL, LDLCALC, LDLDIRECT  Physical Exam:     VS:  BP (!) 120/90   Pulse 68   Ht 5' 11 (1.803 m)   Wt 192 lb (87.1 kg)   SpO2 99%   BMI 26.78 kg/m     Wt Readings from Last 3 Encounters:  06/10/24 192 lb (87.1 kg)  06/12/23 201 lb (91.2 kg)  05/21/23 201 lb 3.2 oz (91.3 kg)     GEN:  Well nourished, well developed in no acute distress HEENT: Normal NECK: No JVD; No carotid bruits LYMPHATICS: No lymphadenopathy CARDIAC: RRR, no murmurs, no rubs, no gallops RESPIRATORY:  Clear to auscultation without rales, wheezing or rhonchi  ABDOMEN: Soft, non-tender, non-distended MUSCULOSKELETAL:  No edema; No deformity  SKIN: Warm and dry LOWER EXTREMITIES: no swelling NEUROLOGIC:  Alert and oriented x 3 PSYCHIATRIC:  Normal affect   ASSESSMENT:    1. Essential hypertension   2. Right bundle branch block   3. Multiple lung nodules   4. Obstructive sleep apnea treated with BiPAP   5. Dyspnea on exertion   6. Dyslipidemia    PLAN:    In order of problems listed above:  Essential hypertension blood pressure well-controlled continue present management. Dyslipidemia I did review KPN which show me his LDL of 80 HDL 42 continue present management. Dyspnea on exertion, I will schedule him to have echocardiogram to assess left ventricular ejection fraction. Obstructive sleep apnea being followed by internal medicine team.   Medication Adjustments/Labs and Tests Ordered: Current medicines are reviewed at length with the patient today.  Concerns regarding medicines are outlined above.  Orders Placed This Encounter  Procedures   EKG 12-Lead   Medication changes: No orders of the defined types were placed in this encounter.   Signed, Lamar DOROTHA Fitch, MD, [facility] 06/10/2024 9:22 AM    [facility]

## 2024-06-10 NOTE — Addendum Note (Signed)
 Addended by: ARLOA DONOVAN SAUNDERS on: 06/10/2024 09:28 AM   Modules accepted: Orders

## 2024-06-10 NOTE — Patient Instructions (Signed)
 Medication Instructions:  Your physician recommends that you continue on your current medications as directed. Please refer to the Current Medication list given to you today.  *If you need a refill on your cardiac medications before your next appointment, please call your pharmacy*  Lab Work: NONE If you have labs (blood work) drawn today and your tests are completely normal, you will receive your results only by: MyChart Message (if you have MyChart) OR A paper copy in the mail If you have any lab test that is abnormal or we need to change your treatment, we will call you to review the results.  Testing/Procedures: Your physician has requested that you have an echocardiogram. Echocardiography is a painless test that uses sound waves to create images of your heart. It provides your doctor with information about the size and shape of your heart and how well your heart's chambers and valves are working. This procedure takes approximately one hour. There are no restrictions for this procedure. Please do NOT wear cologne, perfume, aftershave, or lotions (deodorant is allowed). Please arrive 15 minutes prior to your appointment time.  Please note: We ask at that you not bring children with you during ultrasound (echo/ vascular) testing. Due to room size and safety concerns, children are not allowed in the ultrasound rooms during exams. Our front office staff cannot provide observation of children in our lobby area while testing is being conducted. An adult accompanying a patient to their appointment will only be allowed in the ultrasound room at the discretion of the ultrasound technician under special circumstances. We apologize for any inconvenience.   Follow-Up: At [facility], you and your health needs are our priority.  As part of our continuing mission to provide you with exceptional heart care, our providers are all part of one team.  This team includes your primary Cardiologist  (physician) and Advanced Practice Providers or APPs (Physician Assistants and Nurse Practitioners) who all work together to provide you with the care you need, when you need it.  Your next appointment:   1 year(s)  Provider:   Gypsy Balsam, MD    We recommend signing up for the patient portal called "MyChart".  Sign up information is provided on this After Visit Summary.  MyChart is used to connect with patients for Virtual Visits (Telemedicine).  Patients are able to view lab/test results, encounter notes, upcoming appointments, etc.  Non-urgent messages can be sent to your provider as well.   To learn more about what you can do with MyChart, go to [email or web address].   Other Instructions

## 2024-07-08 ENCOUNTER — Ambulatory Visit: Attending: Cardiology

## 2024-07-08 DIAGNOSIS — R0609 Other forms of dyspnea: Secondary | ICD-10-CM

## 2024-07-08 DIAGNOSIS — I10 Essential (primary) hypertension: Secondary | ICD-10-CM

## 2024-07-08 DIAGNOSIS — R918 Other nonspecific abnormal finding of lung field: Secondary | ICD-10-CM

## 2024-07-08 DIAGNOSIS — G4733 Obstructive sleep apnea (adult) (pediatric): Secondary | ICD-10-CM

## 2024-07-08 DIAGNOSIS — I4510 Unspecified right bundle-branch block: Secondary | ICD-10-CM

## 2024-07-08 DIAGNOSIS — E785 Hyperlipidemia, unspecified: Secondary | ICD-10-CM

## 2024-07-08 LAB — ECHOCARDIOGRAM COMPLETE
Area-P 1/2: 3.6 cm2
MV M vel: 3.35 m/s
MV Peak grad: 44.9 mmHg
S' Lateral: 2.7 cm

## 2024-07-13 ENCOUNTER — Ambulatory Visit: Payer: Self-pay | Admitting: Cardiology
